# Patient Record
Sex: Female | Born: 1950
Health system: Southern US, Community
[De-identification: ages and names within clinical notes are randomized; demographics above are authoritative.]

## PROBLEM LIST (undated history)

## (undated) DIAGNOSIS — K5730 Diverticulosis of large intestine without perforation or abscess without bleeding: Secondary | ICD-10-CM

## (undated) DIAGNOSIS — E785 Hyperlipidemia, unspecified: Secondary | ICD-10-CM

## (undated) DIAGNOSIS — Z923 Personal history of irradiation: Secondary | ICD-10-CM

## (undated) DIAGNOSIS — K439 Ventral hernia without obstruction or gangrene: Secondary | ICD-10-CM

## (undated) DIAGNOSIS — M1990 Unspecified osteoarthritis, unspecified site: Secondary | ICD-10-CM

## (undated) DIAGNOSIS — L719 Rosacea, unspecified: Secondary | ICD-10-CM

## (undated) DIAGNOSIS — K760 Fatty (change of) liver, not elsewhere classified: Secondary | ICD-10-CM

## (undated) DIAGNOSIS — Z9889 Other specified postprocedural states: Secondary | ICD-10-CM

## (undated) DIAGNOSIS — E039 Hypothyroidism, unspecified: Secondary | ICD-10-CM

## (undated) DIAGNOSIS — R112 Nausea with vomiting, unspecified: Secondary | ICD-10-CM

## (undated) DIAGNOSIS — C50919 Malignant neoplasm of unspecified site of unspecified female breast: Secondary | ICD-10-CM

## (undated) DIAGNOSIS — I10 Essential (primary) hypertension: Secondary | ICD-10-CM

## (undated) DIAGNOSIS — C439 Malignant melanoma of skin, unspecified: Secondary | ICD-10-CM

## (undated) DIAGNOSIS — E1121 Type 2 diabetes mellitus with diabetic nephropathy: Secondary | ICD-10-CM

## (undated) DIAGNOSIS — Z8742 Personal history of other diseases of the female genital tract: Secondary | ICD-10-CM

## (undated) DIAGNOSIS — C801 Malignant (primary) neoplasm, unspecified: Secondary | ICD-10-CM

## (undated) HISTORY — PX: BREAST LUMPECTOMY: SHX2

## (undated) HISTORY — DX: Malignant (primary) neoplasm, unspecified: C80.1

## (undated) HISTORY — DX: Fatty (change of) liver, not elsewhere classified: K76.0

## (undated) HISTORY — PX: HEMANGIOMA EXCISION: SHX1734

## (undated) HISTORY — DX: Essential (primary) hypertension: I10

## (undated) HISTORY — DX: Ventral hernia without obstruction or gangrene: K43.9

## (undated) HISTORY — DX: Rosacea, unspecified: L71.9

## (undated) HISTORY — PX: FOOT SURGERY: SHX648

## (undated) HISTORY — PX: WISDOM TOOTH EXTRACTION: SHX21

## (undated) HISTORY — DX: Hyperlipidemia, unspecified: E78.5

## (undated) HISTORY — PX: HERNIA REPAIR: SHX51

---

## 1986-09-09 HISTORY — PX: DIAGNOSTIC LAPAROSCOPY: SUR761

## 1996-09-09 HISTORY — PX: BREAST SURGERY: SHX581

## 1997-09-09 DIAGNOSIS — C801 Malignant (primary) neoplasm, unspecified: Secondary | ICD-10-CM

## 1997-09-09 HISTORY — DX: Malignant (primary) neoplasm, unspecified: C80.1

## 1998-01-11 ENCOUNTER — Encounter: Admission: RE | Admit: 1998-01-11 | Discharge: 1998-04-11 | Payer: Self-pay | Admitting: *Deleted

## 1998-09-28 ENCOUNTER — Ambulatory Visit (HOSPITAL_COMMUNITY): Admission: RE | Admit: 1998-09-28 | Discharge: 1998-09-28 | Payer: Self-pay | Admitting: Otolaryngology

## 1998-09-28 ENCOUNTER — Encounter: Payer: Self-pay | Admitting: Surgery

## 1999-09-25 ENCOUNTER — Encounter: Admission: RE | Admit: 1999-09-25 | Discharge: 1999-09-25 | Payer: Self-pay | Admitting: Family Medicine

## 1999-09-25 ENCOUNTER — Encounter: Payer: Self-pay | Admitting: Family Medicine

## 2000-03-26 ENCOUNTER — Encounter: Admission: RE | Admit: 2000-03-26 | Discharge: 2000-03-26 | Payer: Self-pay | Admitting: Surgery

## 2000-03-26 ENCOUNTER — Encounter: Payer: Self-pay | Admitting: Surgery

## 2001-04-07 ENCOUNTER — Encounter: Admission: RE | Admit: 2001-04-07 | Discharge: 2001-04-07 | Payer: Self-pay | Admitting: Surgery

## 2001-04-07 ENCOUNTER — Encounter: Payer: Self-pay | Admitting: Surgery

## 2001-05-06 ENCOUNTER — Other Ambulatory Visit: Admission: RE | Admit: 2001-05-06 | Discharge: 2001-05-06 | Payer: Self-pay | Admitting: Family Medicine

## 2002-04-12 ENCOUNTER — Encounter: Payer: Self-pay | Admitting: Surgery

## 2002-04-12 ENCOUNTER — Encounter: Admission: RE | Admit: 2002-04-12 | Discharge: 2002-04-12 | Payer: Self-pay | Admitting: Surgery

## 2003-05-03 ENCOUNTER — Encounter: Admission: RE | Admit: 2003-05-03 | Discharge: 2003-05-03 | Payer: Self-pay | Admitting: Surgery

## 2003-05-03 ENCOUNTER — Encounter: Payer: Self-pay | Admitting: Surgery

## 2003-08-05 ENCOUNTER — Emergency Department (HOSPITAL_COMMUNITY): Admission: EM | Admit: 2003-08-05 | Discharge: 2003-08-05 | Payer: Self-pay | Admitting: Emergency Medicine

## 2003-12-16 ENCOUNTER — Encounter (INDEPENDENT_AMBULATORY_CARE_PROVIDER_SITE_OTHER): Payer: Self-pay | Admitting: *Deleted

## 2003-12-16 ENCOUNTER — Ambulatory Visit (HOSPITAL_COMMUNITY): Admission: RE | Admit: 2003-12-16 | Discharge: 2003-12-16 | Payer: Self-pay

## 2003-12-16 ENCOUNTER — Ambulatory Visit (HOSPITAL_BASED_OUTPATIENT_CLINIC_OR_DEPARTMENT_OTHER): Admission: RE | Admit: 2003-12-16 | Discharge: 2003-12-16 | Payer: Self-pay

## 2004-05-22 ENCOUNTER — Encounter: Admission: RE | Admit: 2004-05-22 | Discharge: 2004-05-22 | Payer: Self-pay | Admitting: Surgery

## 2004-09-11 ENCOUNTER — Encounter: Admission: RE | Admit: 2004-09-11 | Discharge: 2004-09-11 | Payer: Self-pay | Admitting: Family Medicine

## 2004-09-11 ENCOUNTER — Other Ambulatory Visit: Admission: RE | Admit: 2004-09-11 | Discharge: 2004-09-11 | Payer: Self-pay | Admitting: Family Medicine

## 2005-06-13 ENCOUNTER — Encounter: Admission: RE | Admit: 2005-06-13 | Discharge: 2005-06-13 | Payer: Self-pay | Admitting: Obstetrics and Gynecology

## 2005-08-12 ENCOUNTER — Ambulatory Visit (HOSPITAL_COMMUNITY): Admission: RE | Admit: 2005-08-12 | Discharge: 2005-08-12 | Payer: Self-pay | Admitting: Obstetrics and Gynecology

## 2006-05-26 ENCOUNTER — Other Ambulatory Visit: Admission: RE | Admit: 2006-05-26 | Discharge: 2006-05-26 | Payer: Self-pay | Admitting: Family Medicine

## 2006-06-16 ENCOUNTER — Encounter: Admission: RE | Admit: 2006-06-16 | Discharge: 2006-06-16 | Payer: Self-pay | Admitting: Surgery

## 2006-06-18 ENCOUNTER — Encounter: Admission: RE | Admit: 2006-06-18 | Discharge: 2006-06-18 | Payer: Self-pay | Admitting: Surgery

## 2006-09-09 HISTORY — PX: ABDOMINAL HYSTERECTOMY: SHX81

## 2007-04-23 ENCOUNTER — Encounter: Admission: RE | Admit: 2007-04-23 | Discharge: 2007-04-23 | Payer: Self-pay | Admitting: Family Medicine

## 2007-06-01 ENCOUNTER — Ambulatory Visit (HOSPITAL_COMMUNITY): Admission: RE | Admit: 2007-06-01 | Discharge: 2007-06-01 | Payer: Self-pay | Admitting: Gynecologic Oncology

## 2007-06-02 ENCOUNTER — Ambulatory Visit: Admission: RE | Admit: 2007-06-02 | Discharge: 2007-06-02 | Payer: Self-pay | Admitting: Gynecologic Oncology

## 2007-06-02 ENCOUNTER — Encounter: Payer: Self-pay | Admitting: Gynecologic Oncology

## 2007-06-02 ENCOUNTER — Other Ambulatory Visit: Admission: RE | Admit: 2007-06-02 | Discharge: 2007-06-02 | Payer: Self-pay | Admitting: Gynecologic Oncology

## 2007-06-19 ENCOUNTER — Encounter: Admission: RE | Admit: 2007-06-19 | Discharge: 2007-06-19 | Payer: Self-pay | Admitting: Surgery

## 2007-07-14 ENCOUNTER — Inpatient Hospital Stay (HOSPITAL_COMMUNITY): Admission: RE | Admit: 2007-07-14 | Discharge: 2007-07-17 | Payer: Self-pay | Admitting: Obstetrics & Gynecology

## 2007-07-14 ENCOUNTER — Encounter: Payer: Self-pay | Admitting: Gynecologic Oncology

## 2007-09-01 ENCOUNTER — Ambulatory Visit: Admission: RE | Admit: 2007-09-01 | Discharge: 2007-09-01 | Payer: Self-pay | Admitting: Gynecologic Oncology

## 2007-10-06 ENCOUNTER — Inpatient Hospital Stay (HOSPITAL_COMMUNITY): Admission: RE | Admit: 2007-10-06 | Discharge: 2007-10-08 | Payer: Self-pay | Admitting: Surgery

## 2008-06-22 ENCOUNTER — Ambulatory Visit (HOSPITAL_COMMUNITY): Admission: RE | Admit: 2008-06-22 | Discharge: 2008-06-22 | Payer: Self-pay | Admitting: Surgery

## 2008-06-28 ENCOUNTER — Encounter: Admission: RE | Admit: 2008-06-28 | Discharge: 2008-06-28 | Payer: Self-pay | Admitting: Surgery

## 2008-09-06 ENCOUNTER — Emergency Department (HOSPITAL_COMMUNITY): Admission: EM | Admit: 2008-09-06 | Discharge: 2008-09-06 | Payer: Self-pay | Admitting: Family Medicine

## 2008-09-09 HISTORY — PX: COLONOSCOPY: SHX174

## 2008-09-30 ENCOUNTER — Inpatient Hospital Stay (HOSPITAL_COMMUNITY): Admission: RE | Admit: 2008-09-30 | Discharge: 2008-10-01 | Payer: Self-pay | Admitting: Surgery

## 2008-12-05 IMAGING — CR DG CHEST 2V
2 series · 2 of 2 positions shown · non-contrast
Comparison: 09/11/04.

CLINICAL DATA: Pelvic mass.  Preop.  
 CHEST ? 2 VIEW:

[w chest pa]
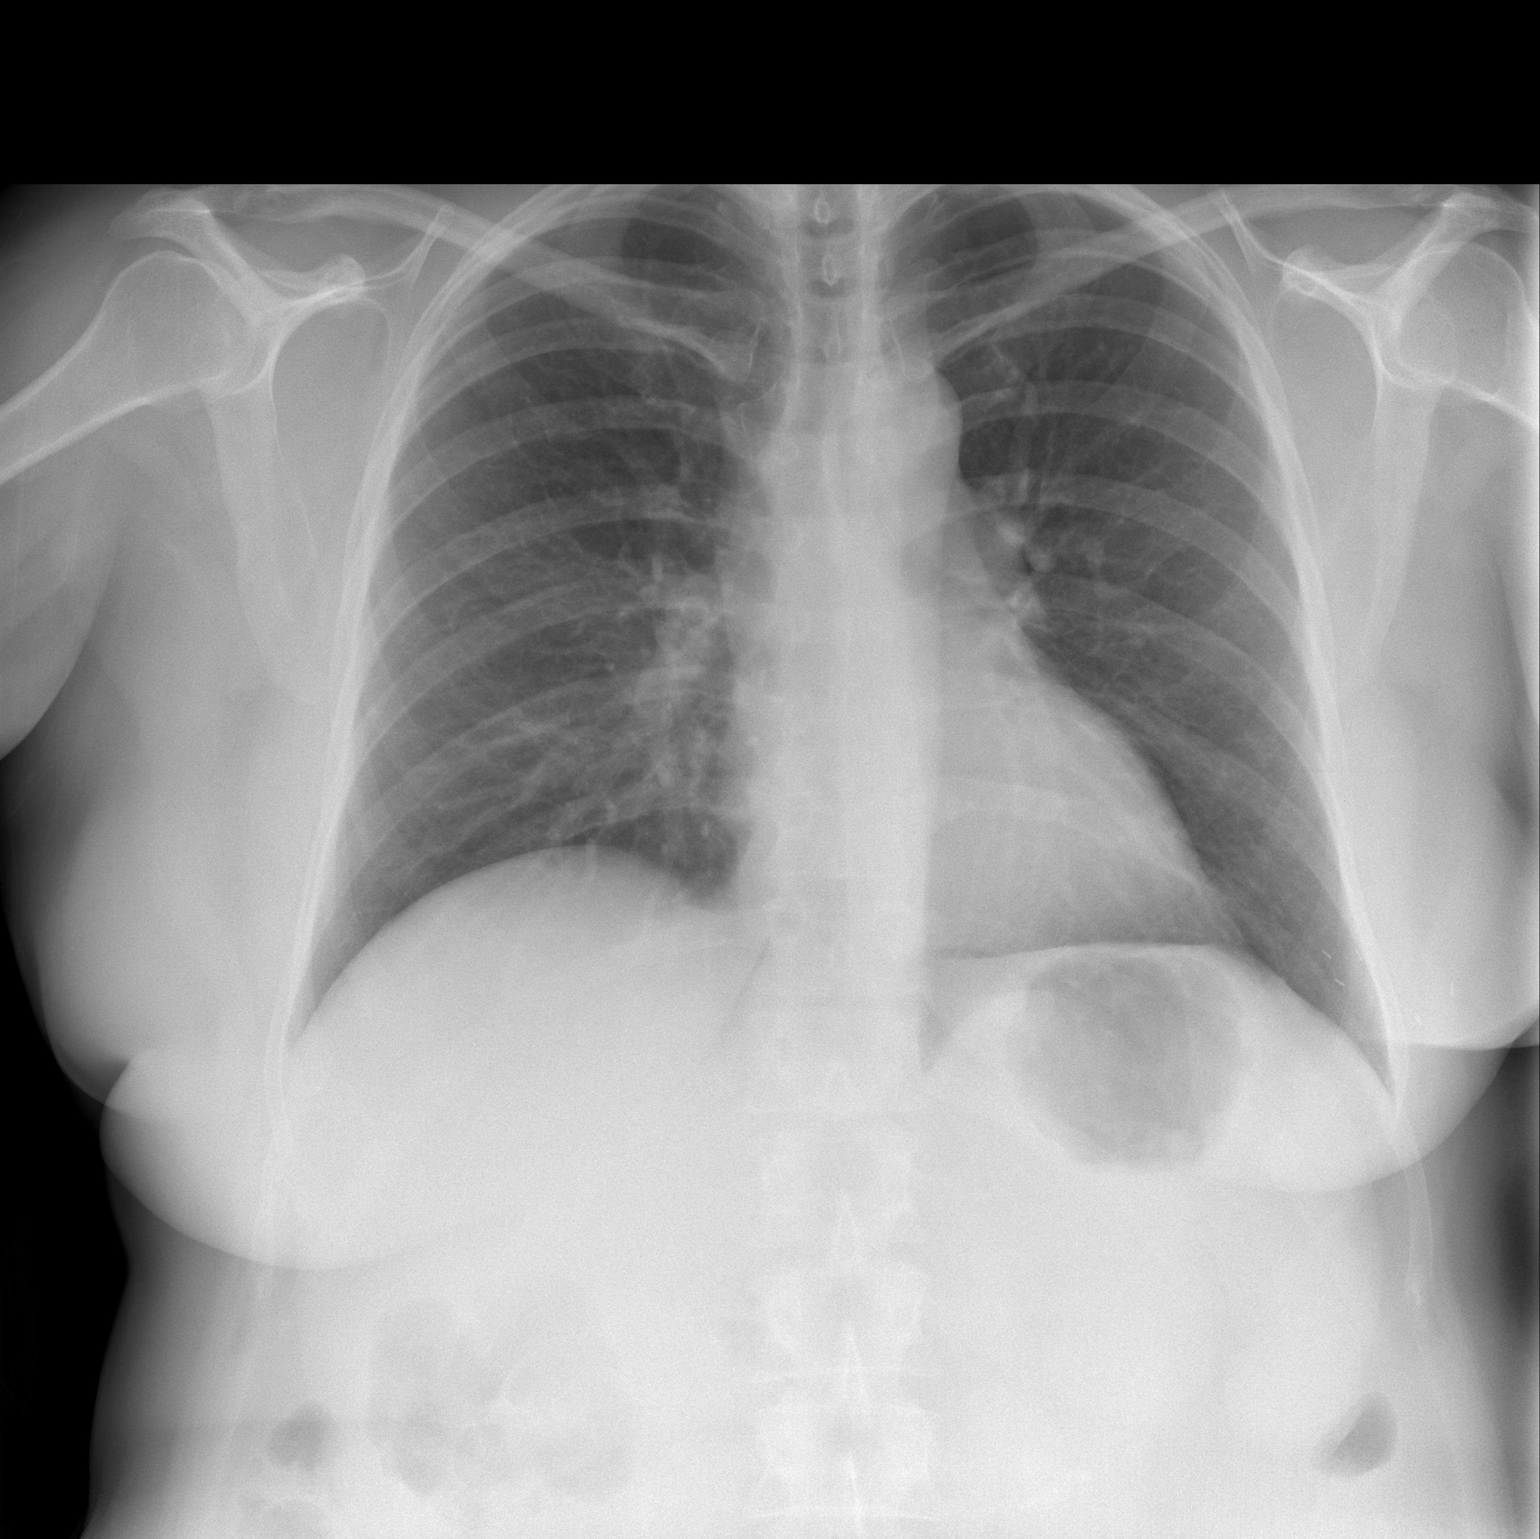

[w chest lat]
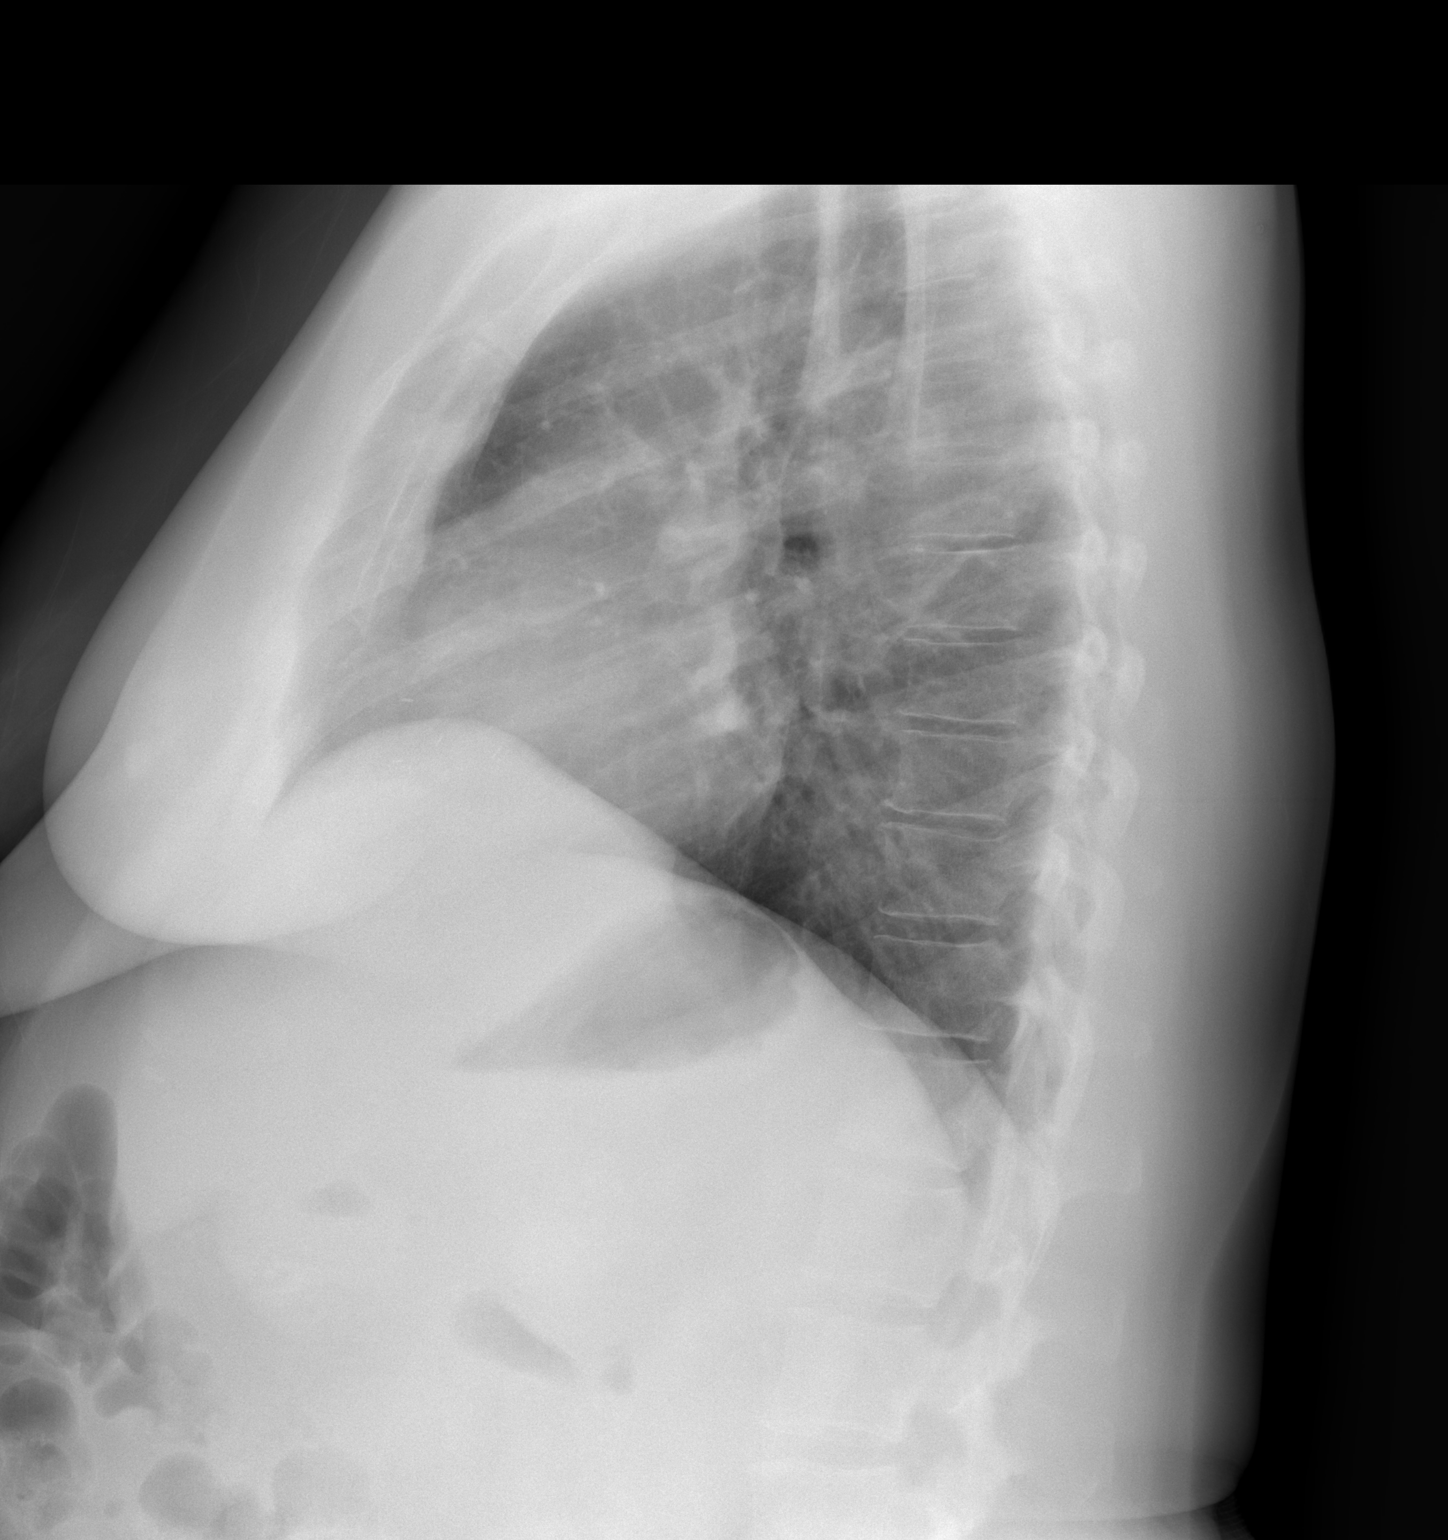

[2 of 2 positions shown; findings below may reference images not displayed]

FINDINGS: The heart size and mediastinal contours are within normal limits.  Both lungs are clear.  The visualized skeletal structures are unremarkable.
IMPRESSION: No active cardiopulmonary disease.  Stable compared to prior exam.

## 2009-03-14 ENCOUNTER — Ambulatory Visit (HOSPITAL_COMMUNITY): Admission: RE | Admit: 2009-03-14 | Discharge: 2009-03-14 | Payer: Self-pay | Admitting: Gastroenterology

## 2009-08-08 ENCOUNTER — Encounter: Admission: RE | Admit: 2009-08-08 | Discharge: 2009-08-08 | Payer: Self-pay | Admitting: Family Medicine

## 2009-08-21 ENCOUNTER — Ambulatory Visit (HOSPITAL_COMMUNITY): Admission: RE | Admit: 2009-08-21 | Discharge: 2009-08-21 | Payer: Self-pay | Admitting: Family Medicine

## 2010-08-09 ENCOUNTER — Encounter: Admission: RE | Admit: 2010-08-09 | Discharge: 2010-08-09 | Payer: Self-pay | Admitting: Surgery

## 2010-09-29 ENCOUNTER — Encounter: Payer: Self-pay | Admitting: Surgery

## 2010-11-12 ENCOUNTER — Other Ambulatory Visit (HOSPITAL_COMMUNITY): Payer: Self-pay | Admitting: Internal Medicine

## 2010-11-12 DIAGNOSIS — R945 Abnormal results of liver function studies: Secondary | ICD-10-CM

## 2010-11-21 ENCOUNTER — Ambulatory Visit (HOSPITAL_COMMUNITY)
Admission: RE | Admit: 2010-11-21 | Discharge: 2010-11-21 | Disposition: A | Discharge status: Home / Self Care | Payer: Commercial Managed Care - PPO | Source: Ambulatory Visit | Attending: Internal Medicine | Admitting: Internal Medicine

## 2010-11-21 DIAGNOSIS — R945 Abnormal results of liver function studies: Secondary | ICD-10-CM

## 2010-11-21 DIAGNOSIS — K7689 Other specified diseases of liver: Secondary | ICD-10-CM | POA: Insufficient documentation

## 2010-11-21 DIAGNOSIS — R7989 Other specified abnormal findings of blood chemistry: Secondary | ICD-10-CM | POA: Insufficient documentation

## 2010-12-24 LAB — CBC
HCT: 28.7 % — ABNORMAL LOW (ref 36.0–46.0)
Platelets: 184 10*3/uL (ref 150–400)
RDW: 12.6 % (ref 11.5–15.5)

## 2010-12-24 LAB — BASIC METABOLIC PANEL
BUN: 11 mg/dL (ref 6–23)
BUN: 17 mg/dL (ref 6–23)
Calcium: 10.3 mg/dL (ref 8.4–10.5)
Calcium: 8.4 mg/dL (ref 8.4–10.5)
Creatinine, Ser: 0.76 mg/dL (ref 0.4–1.2)
GFR calc non Af Amer: >60 mL/min (ref >60)
GFR calc non Af Amer: >60 mL/min (ref >60)
Glucose, Bld: 241 mg/dL — ABNORMAL HIGH (ref 70–99)

## 2010-12-24 LAB — HEMOGLOBIN AND HEMATOCRIT, BLOOD: Hemoglobin: 14.8 g/dL (ref 12.0–15.0)

## 2011-01-22 NOTE — H&P (Signed)
Susan Montes, Susan Montes              ACCOUNT NO.:  [id number]   MEDICAL RECORD NO.:  [id number]         PATIENT TYPE:  LINP   LOCATION:                               FACILITY:  [facility]   PHYSICIAN:  Paola A. Duard Brady, MD    DATE OF BIRTH:  1951/06/20   DATE OF ADMISSION:  07/14/2007  DATE OF DISCHARGE:                              HISTORY & PHYSICAL   PRIMARY CARE PHYSICIAN:  Donia Guiles, M.D. and Gerald Leitz, M.D.   Susan Montes is a 60-year gravida 2, para 2 who has been menopausal for 2  years.  She began having some postmenopausal bleeding.  Ultrasound  revealed the left ovary to have a cystic mass measuring 9.2 x 6.9 x 6.7  cm which was felt to be avascular.  She had a thin endometrial stripe.  The right ovary was normal in appearance.  CA-125 was normal at 7.3.  We  initially saw the patient on June 02, 2007.  Endometrial biopsy was  performed at that time which revealed superficial fragments of benign  proliferative endometrium with tubal metaplasia.  The patient wishes to  proceed with laparoscopic BSO.   PAST MEDICAL HISTORY:  1. Her only past medical history which is significant is a history of      breast cancer diagnosed in 1999.  2. Hypertension.  3. Rosacea.   MEDICATIONS:  1. Lisinopril.  2. __________  Cream.   PAST SURGICAL HISTORY:  1. Lumpectomy and radiation on the left side.  2. Laparoscopy in 1988 for endometriosis.   ALLERGIES:  1. NARCOTICS which cause significant nausea.  2. ANESTHESIA causes nausea.   SOCIAL HISTORY:  She is married.  She works as a Holiday representative at [employer].   PHYSICAL EXAMINATION:  Physical examination performed on June 02, 2007 which will be updated on July 14, 2007 revealed:  GENERAL:  A well-nourished, well-developed female in no acute distress.  LUNGS:  Clear to auscultation bilaterally.  CARDIOVASCULAR:  Regular rate and rhythm.  ABDOMEN:  Morbidly obese, soft and nontender.  There were no palpable  masses  or hepatosplenomegaly.  PELVIC:  Normal external genitalia.  The cervix and the vagina were  atrophic.  Bimanual examination revealed no masses or nodularity, but  exam was limited by habitus.   ASSESSMENT:  A 60 year old complex adnexal mass.  Normal CA-125 and  thickened endometrial stripe with postmenopausal bleeding.  Her biopsy  which we performed in the office on June 02, 2007 was negative.  Will  therefore consent her and proceed with laparoscopic bilateral salpingo-  oophorectomy, possible hysterectomy and staging.  Risks and benefits of  the surgery have been discussed with the patient previously and she  wishes to proceed.      Paola A. Duard Brady, MD  Electronically Signed     PAG/MEDQ  D:  07/13/2007  T:  07/13/2007  Job:  [id number]

## 2011-01-22 NOTE — Op Note (Signed)
NAMEINDEA, DEARMAN              ACCOUNT NO.:  [id number]   MEDICAL RECORD NO.:  [id number]          PATIENT TYPE:  AMB   LOCATION:  ENDO                         FACILITY:  [facility]   PHYSICIAN:  John C. Madilyn Fireman, M.D.    DATE OF BIRTH:  Oct 31, 1950   DATE OF PROCEDURE:  03/14/2009  DATE OF DISCHARGE:                               OPERATIVE REPORT   PROCEDURE PERFORMED:  Colonoscopy.   INDICATIONS FOR PROCEDURE:  Rectal bleeding.   DESCRIPTION OF PROCEDURE:  The patient was placed in the left lateral  decubitus position and placed on the pulse monitor with continuous low-  flow oxygen delivered by nasal cannula.  She was sedated with 100 mcg IV  fentanyl and 10 mg IV Versed.  Olympus video colonoscope was inserted  into the rectum and advanced to the cecum, confirmed by  transillumination of McBurney's point and visualization of the ileocecal  valve and appendiceal orifice.  The prep was good.  The cecum,  ascending, transverse, descending and sigmoid colon all appeared normal  with no masses, polyps, diverticula or other mucosal abnormalities.  The  rectum likewise appeared normal.  On retroflex view of the anus revealed  only small internal hemorrhoids.  No stigma of hemorrhage.  The scope  was then withdrawn and the patient returned to the recovery room in  stable condition.  She tolerated the procedure well.  There were no  immediate complications.   IMPRESSION:  Small internal hemorrhoids, otherwise normal study.   PLAN:  Check Hemoccults in 5 years and repeat screening colonoscopy in  10 years unless Hemoccult is positive earlier.           ______________________________  Everardo All Madilyn Fireman, M.D.     JCH/MEDQ  D:  03/14/2009  T:  03/14/2009  Job:  [id number]   cc:   Donia Guiles, M.D.  Fax: [phone]

## 2011-01-22 NOTE — Op Note (Signed)
Susan Montes, Susan Montes              ACCOUNT NO.:  [id number]   MEDICAL RECORD NO.:  [id number]          PATIENT TYPE:  INP   LOCATION:  0004                         FACILITY:  [facility]   PHYSICIAN:  Sandria Bales. Ezzard Standing, M.D.  DATE OF BIRTH:  19-Feb-1951   DATE OF PROCEDURE:  09/30/2008  DATE OF DISCHARGE:                               OPERATIVE REPORT   Date of surgery ?   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   POSTOPERATIVE DIAGNOSIS:  Recurrent incisional ventral hernia lower  portion of prior repair.   PROCEDURES:  Laparoscopic repair of recurrent incisional ventral hernia  using the previously placed Parietex mesh.   SURGEON:  Sandria Bales. Ezzard Standing, M.D.   FIRST ASSISTANT:  Ollen Gross. Vernell Morgans, M.D.   ANESTHESIA:  General endotracheal.   ESTIMATED BLOOD LOSS:  100 mL.   DRAINS:  None.   INDICATIONS FOR PROCEDURE:  Ms. Wingard is a 60 year old white female  who had a lower abdominal incision in November 2008 for large left  ovarian mass.  Unfortunately she had a very rapid development of a lower  abdominal hernia.  I repaired this hernia on October 01, 2007, with a 20  x 25 cm Parietex mesh.  She did well for about 6 months and then noticed  an increasing bulge in her right lower abdomen.  I did CT scans and  physical examination and she has recurrence of her hernia in the lower  abdomen.   Unfortunately for Marsella she continued to be morbidly obese with a  weight of 221 pounds and a calculated BMI of 37.9.  I have spoken to her  at length about the importance of getting her weight down if these  hernias are not going to come back.   The indications, potential complications of surgery were explained to  the patient.  The potential complications include bleeding, infection,  bowel injury, failure of the hernia again.  We will try to do the  surgery laparoscopically.   OPERATIVE NOTE:  The patient was placed in a supine position.  She  was  given a general anesthetic.  Her abdomen was prepped with Betadine  solution, sterilely draped with Ioban drapes.  She had a Foley catheter  in place.  She was given 1 g of Ancef at initiation of the procedure.  A  time-out was held for identifying the patient and the procedure.   The abdomen was accessed through the left upper quadrant using a 10 mm  Optiview trocar.  I ended up placing a 10 mm trocar in the left mid  abdomen and a 5 mm in the left lower abdomen and a 5 mm in the right  upper abdomen.  I spent the first 15 to 30 minutes taking adhesions down  from her prior mesh repair but these are kind of thin adhesions of  omentum.  There was no bowel stuck to the mesh and actually it came down  fairly easily.  I then spent the next hour to an hour and a half  dissecting out the lower part of  the wound.  It looked like about 80 %  of her mesh had held up well and it was the lower part right in the  suprapubic area where this mesh had failed.  However, I was able to find  a rim of fascia which was anterior to the pubic bone and it looked like  she had about maybe a 2 cm rim that I could possibly sew to.   I then spent better than an hour placing the Ethibond sutures.  I placed  one #1 Ethibond and then I placed eight 0 Ethibond sutures using the tie  knots to cinch these down in with this mesh brought back to the  suprapubic fascia.  When I was finished I thought I had a good closure  and returned the mesh to its original location.  I then took the  peritoneum from the hernia site that I dissected out and flapped it back  on the undersurface of the Parietex mesh and sewed this in placed with  interrupted running 3-0 Vicryl sutures again trying to protect this new  repair.   At this point I reinspected the abdomen.  She did have some bleeding  from her mesentery.  I found a couple little vessels that I either used  cautery on or used the Harmonic scalpel on and irrigated the  abdomen  with saline so I probably lost 100 plus mL from this omental drainage.  I will check a CBC on her tomorrow.   Otherwise her sponge and needle count were correct at the end of the  case.  She tolerated the procedure well.  She was transferred to  recovery room in good condition.      Sandria Bales. Ezzard Standing, M.D.  Electronically Signed     DHN/MEDQ  D:  09/30/2008  T:  09/30/2008  Job:  [id number]   cc:   Donia Guiles, M.D.  Fax: [phone]

## 2011-01-22 NOTE — Consult Note (Signed)
NAMECHELLY, DOMBECK              ACCOUNT NO.:  [id number]   MEDICAL RECORD NO.:  [id number]          PATIENT TYPE:  OUT   LOCATION:  GYN                          FACILITY:  [facility]   PHYSICIAN:  Paola A. Duard Brady, MD    DATE OF BIRTH:  1951/04/02   DATE OF CONSULTATION:  06/02/2007  DATE OF DISCHARGE:                                 CONSULTATION   PRIMARY CARE PHYSICIAN:  Donia Guiles, MD.   REASON FOR CONSULTATION:  The patient is seen today in consultation at  the request of Dr. Richardson Dopp.   HISTORY OF PRESENT ILLNESS:  Ms. Elden is a 60 year old gravida 2 para  2 who has been menopausal for about 2 years.  She was complaining of  some sore breasts, was seen by her primary physician and had a mammogram  done.  She then began having some postmenopausal bleeding.  She was seen  by Dr. Richardson Dopp.  At Dr. Dawayne Patricia office, an ultrasound was performed.  The  ultrasound revealed the left ovary to have a cystic mass with septations  measuring 9.2 x 6.9 x 6.7 cm.  They were felt to be avascular.  Left  ovarian tissue was not seen.  The endometrial lining was thin at 0.39  cm.  The right ovary was normal in appearance.  Pelvic examination was  deferred.  An endometrial biopsy or Pap smear were not performed at that  time.  A CA125 was performed that was normal at 7.6.  We were then  consulted to see the patient.  The patient had a transvaginal ultrasound  performed yesterday which revealed the uterus to be 6.2 x 2.9 x 3.7 cm.  The endometrial stripe thickness was enlarged at 6.5 mm.  There is a  complex cystic lesion involving the adnexa measuring 8.5 x 6 x 8 cm;  surrounding thin rim of ovarian tissue was noted.  There were 2 thick  partial septations noted which demonstrated some flow as well as a mural  nodule measuring 1.6 x 1.3 cm.  There was no pelvic fluid seen.  The  right ovary could not be seen with confidence.  I was called with the  results yesterday.  The bleeding that she had lasted for  about 7 days; 2-  3 days were fairly heavy when she was changing a pad about 2-to-3 times  per day, then lighter and crampy.  She has had no bleeding since that  time.  She states now that she has this known mass, she is experiencing  some cramping but retrospect was not feeling any cramping, any pelvic  pressure or change in bowel or bladder habits.   PAST MEDICAL HISTORY:  Significant for breast cancer diagnosed in 1999.  She has hypertension and rosacea.   MEDICATIONS:  Lisinopril, Mycillin, MetroGel cream.   PAST SURGICAL HISTORY:  Lumpectomy and radiation on the left side.  She  was not on any tamoxifen.  She did not receive any chemotherapy.  She  had laparoscopy in 1988 for endometriosis.  She had that surgery 3 years  ago due to vascular issues.   ALLERGIES:  NARCOTICS which cause significant nausea.  She has  significant nausea with ANESTHESIA and very fearful of anesthetics with  their subsequent nausea.   SOCIAL HISTORY:  She is married.  She denies use of tobacco.  She drinks  alcohol occasionally.  She works as a Holiday representative at [employer].  Her  husband is in Air[employer].   FAMILY HISTORY:  Her maternal grandmother had breast cancer at the age  of 68.  Her father had hypertension.  She has 2 children.  She has had 2  vaginal deliveries.   HEALTH MAINTENANCE:  She had a Pap smear that was normal in September,  2007.  She never had a colonoscopy but does have occult blood sampling  that was negative.   PHYSICAL EXAMINATION:  VITAL SIGNS:  Weight 222 pounds, height 5 feet 4  inches, blood pressure 134/84, pulse 96.  GENERAL:  Well-nourished, well-developed female in no acute distress.  NECK:  Supple.  There is no lymphadenopathy, no thyromegaly.  LUNGS:  Clear to auscultation bilaterally.  CARDIOVASCULAR:  Regular rate and rhythm.  ABDOMEN:  Morbidly obese, soft, nontender, nondistended.  There are no  palpable masses or hepatosplenomegaly.  Exam is limited by  habitus.  Groins are negative for adenopathy.  EXTREMITIES:  There is no edema.  PELVIC:  External genitalia is within normal limits.  Speculum was  inserted.  The vagina is without lesions.  The cervix is visualized; it  is multiparous.  There is a physiologic discharge.  There are no visible  lesions.  ThinPrep Pap was submitted without difficulty.  After  obtaining the patient's consent, the cervix was grasped with a single  tooth tenaculum.  The endometrial biopsy Pipelle was passed without  difficulty.  The uterus sounded to 9 cm.  Endometrial biopsy was  performed without difficulty.  The patient tolerated it well.  Tenaculum  was removed.  There is no bleeding on the cervix.  Bimanual examination  is limited by habitus.  I cannot appreciate any masses.   ASSESSMENT:  The patient is a 60 year old with a complex left adnexal  mass, normal CA125, thickened endometrial stripe and postmenopausal  bleeding.   PLAN:  We will follow up the results for biopsy from today.  She very  much would like to have her surgery done at [facility].  She does  understand the issue we have of operative shortage here.  If the biopsy  is positive, I would recommend that she go to [facility] for definitive  surgery with lymphadenectomy.  If the biopsy is negative, while it would  be reasonable to have the surgery as soon as possible, she may be  interested in waiting until November, which is our next available OR  time here, in order to have the surgery at [facility].  We will  determine this issue with the patient once we have the results of the  biopsy.  I discussed with the patient that I would be willing to try to  perform her surgery laparoscopically.  The limitation is her body  habitus.  She does understand the need for possible laparotomy and  accepts that.  We will determine her final plan based on the results  from her endometrial biopsy.      Paola A. Duard Brady, MD  Electronically  Signed     PAG/MEDQ  D:  06/02/2007  T:  06/03/2007  Job:  [id number]   cc:   Gerald Leitz, MD   Izora Ribas  Arvilla Market, M.D.  Fax: [phone]   Telford Nab, R.N.  [address]  [place], [place] [address]

## 2011-01-22 NOTE — Op Note (Signed)
Susan Montes, Susan Montes              ACCOUNT NO.:  [id number]   MEDICAL RECORD NO.:  [id number]          PATIENT TYPE:  INP   LOCATION:  5153                         FACILITY:  [facility]   PHYSICIAN:  Sandria Bales. Ezzard Standing, M.D.  DATE OF BIRTH:  Sep 28, 1950   DATE OF PROCEDURE:  10/06/2007  DATE OF DISCHARGE:                               OPERATIVE REPORT   Operative Note   PREOPERATIVE DIAGNOSIS:  Lower abdominal incisional hernia.   POSTOPERATIVE DIAGNOSIS:  Lower abdominal incisional hernia  approximately 15 x 16 cm in diameter.   PROCEDURE:  Laparoscopic ventral hernia repair with Parietex mesh 20 x  25 cm.   SURGEON:  Ovidio Kin, Sabino Snipes, [facility] is first assistant.   ANESTHESIA:  General endotracheal.   ESTIMATED BLOOD LOSS:  Minimal.   INDICATIONS FOR PROCEDURE:  Ms. Messman is a 60 year old white female  who sees Dr. Donia Guiles from a primary care standpoint.  She  underwent a total abdominal hysterectomy in November 2008 for a large  cystic left ovarian mass which proved to be benign.  Her surgery was  carried out by Dr. Cleda Mccreedy.  However, fairly early after surgery,  Ms. Candella noticed a bulge in her lower abdomen and on physical exam  she has a lower abdominal incisional hernia.   I discussed with the patient repairing this hernia both laparoscopic and  open repairs, the risk of surgery includes bleeding, infection,  inability to repair the hernia laparoscopically and the significant risk  of recurrence, particularly, with her location being her lower abdomen.   OPERATIVE NOTE:  The patient comes to the operating room under general anesthesia.  She  had a Foley catheter placed, PAS stockings in place, given a gram of  Ancef at initiation of the procedure.  Her abdomen was prepped with  Betadine solution and an Ioban drape placed over abdomen.  I accessed  her abdomen through left upper quadrant 11 mm Ethicon trocar.  Once in  the abdomen, I insufflated  the abdomen. Both right and left lobes of the  liver have evidence of fatty infiltration.  Anterior wall of the stomach  was unremarkable.  Her bowel that I could see was unremarkable.   She had a lot of adhesions to her lower abdominal wall to the hernia but  these were primarily omental adhesions with no bowel stuck in it.  I  spent about 15-30 minutes taking down these adhesions out of the lower  abdominal wall incisional hernia sac and then identified the defect  which was fairly large about 15 cm in width, 16 cm in length and went  down to just above the pubic bone.   I then carried out a repair of her hernia.  I got a piece of 20 x 30  Parietex mesh.  However, I cut the top 4 cm off for about a 26 x 20 cm  piece of mesh.  I placed 12 sutures in a circumferential pattern and  actually across the bottom of the mesh I placed five sutures with a more  sutures in the lower part  as I figured this was where the potential for  failure would come.   I also tried to take down some of the preperitoneal fat down to the  pubic bone, took photos of the hernia.  Unfortunately the printer  machine was not working very well so the pictures were a very poor  quality.   I placed the prosthesis mesh in the abdominal cavity.  I then used a  suture grasper to grab the mesh and pull this up to the anterior  abdominal wall.  I sewed 0-0 Novofil sutures into the Parietex mesh,  that is where I pulled through the abdominal wall.  I tied these knots  down.  I then used a stapler.  I used a total of 54 staples trying to go  1 cm around the edge of the mesh and then did some internal staples. I  stapled inside to kind of tack the mesh up to the anterior abdominal  wall.   At this point the patient tolerated procedure well.  Again the bowel  really never was involved in this dissection.  I desufflated the abdomen  twice making sure the mesh lay flat.  There was no bowel being trapped,  no hernia defect  identified.   The patient is transferred to recovery room, will spend the night in the  hospital and her length of hospitalization will depend on how well she  does with her physical activity and her diet.      Sandria Bales. Ezzard Standing, M.D.  Electronically Signed     DHN/MEDQ  D:  10/06/2007  T:  10/07/2007  Job:  [id number]   cc:   Donia Guiles, M.D.  Paola A. Duard Brady, MD

## 2011-01-22 NOTE — Op Note (Signed)
Susan Montes, Susan Montes              ACCOUNT NO.:  [id number]   MEDICAL RECORD NO.:  [id number]          PATIENT TYPE:  INP   LOCATION:  1529                         FACILITY:  [facility]   PHYSICIAN:  Paola A. Duard Brady, MD    DATE OF BIRTH:  Jan 01, 1951   DATE OF PROCEDURE:  07/14/2007  DATE OF DISCHARGE:                               OPERATIVE REPORT   PREOPERATIVE DIAGNOSIS:  Pelvic mass.   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   PROCEDURE:  1. Total abdominal hysterectomy.  2. Bilateral salpingo-oophorectomy.  3. Umbilical hernia repair.   SURGEONS:  Paola A. Duard Brady, MD and Roseanna Rainbow, M.D.   ASSISTANT:  Telford Nab, R.N.   ANESTHESIA:  General.   ANESTHESIOLOGIST:  Jenelle Mages. Fortune, M.D.   ESTIMATED BLOOD LOSS:  100 mL.   URINE OUTPUT:  250 mL.   IV FLUIDS:  2000 mL.   SPECIMENS:  Uterus, cervix, bilateral tubes and ovaries to pathology.   OPERATIVE FINDINGS INCLUDED:  An 8 cm simple appearing left ovarian mass  on frozen section consistent with a benign mucinous cyst adenofibroma.  Normal atrophic appearing right ovary, normal size uterus.  There is a 1-  cm umbilical hernia with adherent omentum within it.   DESCRIPTION OF PROCEDURE:  The patient was taken to the operating room,  and placed in the supine position where general anesthesia was induced.  This was done after confirming the patient's operative side, reviewed,  consent on chart.  General anesthesia was then induced.  She was placed  in the dorsal lithotomy position with all appropriate precautions.  The  abdomen was then prepped in the usual sterile fashion.  The perineum and  vagina were prepped in the usual sterile fashion, and Foley catheter was  inserted into the bladder under sterile conditions.  The patient was  then draped.  Time-out was then preformed.   A vertical infraumbilical incision was made with a knife and carried  down to the underlying fascia.   The fascia was scored.  The fascial  incision was extended superiorly and inferiorly.  The rectus bellies  were dissected off the overlying fascia.  The peritoneum was tented and  elevated sharply.  The peritoneal incision was extended superiorly and  inferiorly with direct visualization of the underlying peritoneal  cavity.  The patient was then placed in Trendelenburg.  The Bookwalter  self-retaining retractor was then placed on the bed and the medium  blades were placed with all appropriate precautions being taken.  At  this point, and at several points during the case, the position of the  Bookwalter was checked to ensure no significant pressure on that the  psoas muscles.  The large-and-small bowel were then packed using moist  laparotomy sponges.   Our attention was first drawn to the left side.  The utero cornu were  grasped with curved Kelly's.  The round ligament on the patient's left  side was transected with Bovie cautery.  The anterior leaf and posterior  leaves of the broad ligament was then opened with monopolar cautery.  The ureter was identified.  A window was made between the IP and the  ureter on the left side.  The IP was clamped, transected, and suture  ligated with #0 Vicryl.  We then came across the utero-ovarian pedicle,  and the specimen was sent for frozen section.  The bladder flap was then  created in the left side.  The uterine arteries were skeletonized.  They  were clamped x2, transected, suture ligated with #0 Vicryl.   A similar procedure was performed on the patient's right side.  The  round ligament was transected using cautery.  The anterior posterior  leaves of broad ligament were opened.  The bladder flap was then  completed to a point below the level of the cervix.  The posterior leaf  of broad ligament was then opened, the ureter was identified on the  right side.  A window was made between the IP and the ureter.  The IP  was clamped x2,  transected, and suture ligated with #0 Vicryl.  We then  skeletonized the uterine arteries on the patient's right side.  They  were clamped with curved Masterson transected and suture ligated.  We  then continued bilaterally along the cardinal ligament with successive  clamps of Masterson, transection, and suture ligature.  We came across  the cervicovaginal angle with curved Masterson and specimen was handed  off for permanent section.  The vaginal angles were then secured with #0  Vicryl.  The vaginal cuff was closed using interrupted figure-of-eight  sutures of #0 Vicryl.  The abdomen and pelvis were copiously irrigated,  and all pedicles were evaluated and noted to be hemostatic.   Our attention was then drawn to the umbilical hernia.  The bowel  retractors were then removed.  The omentum that was adherent within the  umbilical hernia was then freed using monopolar cautery.  The hernia was  identified.  It was repaired using figure-of-eight suture of #0 Vicryl.  The remainder of the fascial incision was closed using a running mass  closure of #1 PDS.  The subcu tissues were copiously irrigated.  The  skin was closed using skin clips.   The patient tolerated the procedure well and was taken to the recovery  room in stable condition.  All instrument, needle, laparotomy, and Ray-  Tec counts were correct x2.      Paola A. Duard Brady, MD  Electronically Signed     PAG/MEDQ  D:  07/14/2007  T:  07/14/2007  Job:  [id number]   cc:   Maxie Better, M.D.  Fax: [phone]   Roseanna Rainbow, M.D.  Fax: [phone]   Trudee Kuster, M.D.  [address]  [place]  [place] [address]

## 2011-01-22 NOTE — H&P (Signed)
NAMECATTLEYA, Susan Montes              ACCOUNT NO.:  [id number]   MEDICAL RECORD NO.:  [id number]          PATIENT TYPE:  OUT   LOCATION:  GYN                          FACILITY:  [facility]   PHYSICIAN:  Paola A. Duard Brady, MD    DATE OF BIRTH:  1950/11/11   DATE OF ADMISSION:  09/01/2007  DATE OF DISCHARGE:  09/01/2007                              HISTORY & PHYSICAL   Susan Montes is a very pleasant 60 year old who was referred to me by Dr.  Richardson Montes for postmenopausal bleeding as well as a large cystic mass on the  left ovary measuring 9.2 x 6.9 x 6.7 cm.  The patient underwent an  endometrial biopsy which was unremarkable and subsequent underwent  surgery on July 14, 2007.  At that time she underwent exploratory  laparotomy, TAH-BSO and umbilical hernia repair.  Frozen section was  consistent with a benign mucinous cystadenoma.  Final pathology was also  consistent with a benign mucinous cystadenoma of the left ovary.  The  uterus and right tube and ovary were completely unremarkable with no  evidence of malignancy or endometriosis.  The patient did well  postoperatively.  She comes in today for a postoperative visit.  She  states that she has noticed a bulge on her right side and it has been  fairly immediate since the time of her surgery and it does cause her  some pain.  She notices it more when she is standing up, not when she is  lying down.  There is no associated nausea or vomiting with this bulge.  She has no fevers or chills.   PAST MEDICAL HISTORY:  1. Breast cancer diagnosed in 1999.  2. Hypertension.  3. Rosacea.   MEDICATIONS:  Lisinopril, Mysoline and MetroGel cream.   ALLERGIES:  NARCOTICS cause nausea.   PHYSICAL EXAMINATION:  Weight 220 pounds.  Well-nourished, well-developed female in no acute distress.  ABDOMEN:  Upon standing the patient does have an approximately 10-cm  bulge in the right lower abdomen, which is somewhat tender.  It is tense  but not hard.  The  patient was then placed in the supine position.  She  has a well-healed vertical skin incision.  It does appear that she has a  right-sided incisional hernia measuring approximately 4 cm on the  inferior aspect.  The bulge is no longer present and is most likely  consistent with small bowel contents.  At the lower aspect of the margin  of the incision on the right side, a defect is palpable within the  incision.  There remainder of the abdomen is soft and nontender.  PELVIC:  External genitalia is within normal limits.  Speculum  examination revealed the vaginal cuff to be well-healed.  Bimanual  examination:  The vaginal cuff is well healed.  There is no nodularity.   ASSESSMENT:  A 60 year old with a history of breast cancer who had a  large adnexal mass, who on final pathology, fortunately, has benign  disease.  However, unfortunately she has an incisional hernia.  The  patient did have an umbilical hernia repaired at the time  of her surgery  and this appears to be in a separate location.  The patient states that  she had a lot of sneezing immediately postoperatively, at one point did  feel a sharp pain and feels that that might be when a stitch may have  popped.  She has previously had surgery with Dr. Ovidio Montes for her  breast cancer and would like to be evaluated by him for consideration of  a laparoscopic hernia repair.  We have contacted his office and his  office will contact the patient with a date and time for an appointment.  She is very pleased did not have an oncologic though, of course, is  disappointed with the hernia.  I did discuss this with her and she  understands  that this was a risk of surgery and considering her prior hernia, she  was not entirely surprised that this was the case.  She knows that we  will be happy to see her in the future should the need arise.  Because  she has benign disease, she can be released to her primary physician.  We will be happy to  see her in the future should the need arise.      Paola A. Duard Brady, MD  Electronically Signed     PAG/MEDQ  D:  09/01/2007  T:  09/02/2007  Job:  [id number]   cc:   Gerald Leitz, MD   Donia Guiles, M.D.  Fax: [phone]   Telford Nab, R.N.  [address]  [place], [place] [address]   Sandria Bales. Ezzard Standing, M.D.  [address], [address]  [place]  [place] [address]

## 2011-01-25 NOTE — Op Note (Signed)
NAME:  Susan Montes, Susan Montes                        ACCOUNT NO.:  [id number]   MEDICAL RECORD NO.:  [id number]                   PATIENT TYPE:  AMB   LOCATION:  DSC                                  FACILITY:  [facility]   PHYSICIAN:  Alvan Dame, D.P.M.              DATE OF BIRTH:  [birth year]   DATE OF PROCEDURE:  12/16/2003  DATE OF DISCHARGE:                                 OPERATIVE REPORT   PREOPERATIVE DIAGNOSIS:  Suspect soft tissue mass, cannot rule out  hemangioma or dermal skin lesion, plantar right forefoot.   POSTOPERATIVE DIAGNOSIS:  Suspect soft tissue mass, cannot rule out  hemangioma or dermal skin lesion, plantar right forefoot.   PROCEDURE:  Excision of lesion with surrounding border and closure with a  bipedicle flap.   SURGEON:  Alvan Dame, D.P.M.   INDICATIONS:  The patient has had a several year history of a lesion on the  plantar aspect of the foot which has been painful and symptomatic,  fluctuating in size somewhat although for the most part it has been  relatively stable.  The patient also has a history of carcinoma in the past  and at this time there is concern about the possibility that this could also  be some sort of a metastatic lesion which cannot be ruled out.   ANESTHESIA:  IV sedation augmented with local anesthetic, a total of 10 mL,  50:50 mixture of 2% Xylocaine plain and 0.5% Marcaine plain with a PT block  and regional block furnished at this time.  Anesthesia was MAC.   HEMOSTASIS:  Right ankle tourniquet to 250 mmHg.   DESCRIPTION OF PROCEDURE:  The patient was brought to the operating room and  placed on the table in the supine position.  IV sedation was established.  The foot was prepped and draped in the usual aseptic manner.  Local  anesthetic was administered.  The foot was exsanguinated with the Esmarch  wrap and ankle tourniquet inflated to 2590 mmHg.  The following procedure  was then carried out.   Attention was directed to the  plantar aspect of the right foot underlying  the third and fourth metatarsal area in particular.  Approximately a 1.5 cm  lesion was identified.  At least a nearly 1 cm border was made around this  for a total excision of the lesion extending just under 2.5 cm, perhaps 2.25  cm.  At this time, the lesion was noted to be undermined and did not extend  down past subcu tissue.  It seemed to be isolated strictly to the dermal and  subdermal tissue.  It was easily excised to the underlying fat and submitted  in formalin for pathology.  I did not find the need for a _____ section as  the entire lesion was excised in toto at this time and I believe a more  accurate diagnosis with a total section being submitted.  At this  time, the  site was lavaged with copious amounts of sterile antibiotic solutions,  cleared of all soft tissue debris and flap closure was at this time planned.  A bipedicle flap was made from a proximal direction and the flaps oriented  and sutured in place with minimal tension utilizing a combination of 4-0  Prolene and 3-0 nylon just proximal to the site.  The foot was infiltrated  with approximately 10 mg of Decadron or dexamethasone phosphate.  The site  was also dressed with 1/4 inch Steri-Strips to help reduce tension or pull  on the incisions.  For the skin flap, Betadine, saline-soaked sponge and a  dry sterile dressing were applied to the right foot.  Ankle tourniquet was  deflated with immediate return of perfusion to all digits being noted.  I  should not the patient tolerated the procedure and anesthesia well and was  returned from the OR to recovery in satisfactory condition.  She will be  discharged with all written postoperative instructions, prescriptions for  pain medication.  The patient will be  follow postoperatively within the next week in our office.  The patient will  maintain nonweight-bearing in a surgical boot for a period of approximately  three weeks  and will contact the physician if there are any difficulties or  changes.  The patient will be contacted once pathology reports are  available.                                               Alvan Dame, D.P.M.    RS/MEDQ  D:  12/16/2003  T:  12/16/2003  Job:  [id number]

## 2011-01-25 NOTE — Discharge Summary (Signed)
NAMESHAMILA, LERCH              ACCOUNT NO.:  [id number]   MEDICAL RECORD NO.:  [id number]          PATIENT TYPE:  INP   LOCATION:  1529                         FACILITY:  [facility]   PHYSICIAN:  Roseanna Rainbow, M.D.DATE OF BIRTH:  [birth year]   DATE OF ADMISSION:  07/14/2007  DATE OF DISCHARGE:  07/17/2007                               DISCHARGE SUMMARY   CHIEF COMPLAINT:  The patient is a 60 year old para 2 with a complex  adnexal mass who presents for bilateral salpingo-oophorectomy and  possible hysterectomy with staging.  Please see the dictated history and  physical as per Dr. Cleda Mccreedy for further details.   HOSPITAL COURSE:  The patient was admitted.  She underwent a total  abdominal hysterectomy and bilateral salpingo-oophorectomy and umbilical  herniorrhaphy.  Please see the dictated operative summary.  On  postoperative day #1 her hemoglobin was 12.  Her basic metabolic profile  was normal.  She complained of mild nausea.  On postoperative day #2 she  reported flatus and was tolerating a regular diet.  The remainder of her  postoperative course was uneventful.  She was discharged to home on  postoperative day #3.   DISCHARGE DIAGNOSES:  1. Benign mucinous cystadenoma, left ovary.  2. Benign endocervical polyp.  3. Adenomyosis.  4. Umbilical hernia.   PROCEDURES:  Total abdominal hysterectomy, bilateral salpingo-  oophorectomy and umbilical herniorrhaphy.   CONDITION:  Good.   DIET:  Regular.   ACTIVITY:  Progressive activity, pelvic rest.   MEDICATIONS:  Medications included her preoperative medications,  lisinopril, hydrochlorothiazide, minocycline, fish oil, multivitamins  and Zyrtec.  A prescription was given as well for Ultram and over-the-  counter ibuprofen was encouraged.   DISPOSITION:  The patient was to follow up on July 20, 2007 between  10:00 a.m. and 12 noon for staple removal at the GYN oncology office.      Roseanna Rainbow,  M.D.  Electronically Signed     LAJ/MEDQ  D:  08/11/2007  T:  08/11/2007  Job:  [id number]   cc:   Telford Nab, R.N.  [address]  [place], [place] [address]   Donia Guiles, M.D.  Fax: [phone]   Gerald Leitz, MD

## 2011-05-17 ENCOUNTER — Emergency Department (HOSPITAL_COMMUNITY)
Admission: EM | Admit: 2011-05-17 | Discharge: 2011-05-18 | Disposition: A | Discharge status: Home / Self Care | Payer: 59 | Attending: Emergency Medicine | Admitting: Emergency Medicine

## 2011-05-17 DIAGNOSIS — R61 Generalized hyperhidrosis: Secondary | ICD-10-CM | POA: Insufficient documentation

## 2011-05-17 DIAGNOSIS — I10 Essential (primary) hypertension: Secondary | ICD-10-CM | POA: Insufficient documentation

## 2011-05-17 DIAGNOSIS — R0602 Shortness of breath: Secondary | ICD-10-CM | POA: Insufficient documentation

## 2011-05-17 DIAGNOSIS — M79609 Pain in unspecified limb: Secondary | ICD-10-CM | POA: Insufficient documentation

## 2011-05-17 DIAGNOSIS — Z79899 Other long term (current) drug therapy: Secondary | ICD-10-CM | POA: Insufficient documentation

## 2011-05-17 DIAGNOSIS — R252 Cramp and spasm: Secondary | ICD-10-CM | POA: Insufficient documentation

## 2011-05-17 DIAGNOSIS — E119 Type 2 diabetes mellitus without complications: Secondary | ICD-10-CM | POA: Insufficient documentation

## 2011-05-18 ENCOUNTER — Ambulatory Visit (HOSPITAL_COMMUNITY)
Admission: EM | Admit: 2011-05-18 | Discharge: 2011-05-18 | Disposition: A | Discharge status: Home / Self Care | Payer: 59 | Source: Ambulatory Visit | Attending: Emergency Medicine | Admitting: Emergency Medicine

## 2011-05-18 DIAGNOSIS — M79609 Pain in unspecified limb: Secondary | ICD-10-CM

## 2011-05-18 LAB — BASIC METABOLIC PANEL
BUN: 28 mg/dL — ABNORMAL HIGH (ref 6–23)
Calcium: 9 mg/dL (ref 8.4–10.5)
Chloride: 101 mEq/L (ref 96–112)
Creatinine, Ser: 0.83 mg/dL (ref 0.50–1.10)
GFR calc Af Amer: >60 mL/min (ref >60)

## 2011-05-18 LAB — DIFFERENTIAL
Basophils Absolute: 0 10*3/uL (ref 0.0–0.1)
Basophils Relative: 1 % (ref 0–1)
Eosinophils Absolute: 0.5 10*3/uL (ref 0.0–0.7)
Eosinophils Relative: 7 % — ABNORMAL HIGH (ref 0–5)
Lymphocytes Relative: 29 % (ref 12–46)
Monocytes Absolute: 0.6 10*3/uL (ref 0.1–1.0)

## 2011-05-18 LAB — CBC
HCT: 37.7 % (ref 36.0–46.0)
MCHC: 35.3 g/dL (ref 30.0–36.0)
Platelets: 206 10*3/uL (ref 150–400)
RDW: 12.4 % (ref 11.5–15.5)

## 2011-05-18 LAB — PROTIME-INR: INR: 0.84 (ref 0.00–1.49)

## 2011-05-22 ENCOUNTER — Encounter (INDEPENDENT_AMBULATORY_CARE_PROVIDER_SITE_OTHER): Payer: Self-pay | Admitting: Surgery

## 2011-05-30 LAB — CBC
MCHC: 34.2
MCV: 91.2
Platelets: 229

## 2011-05-30 LAB — BASIC METABOLIC PANEL
BUN: 19
CO2: 27
Chloride: 104
Creatinine, Ser: 0.94

## 2011-06-18 LAB — BASIC METABOLIC PANEL
Calcium: 8.7
Creatinine, Ser: 0.83
GFR calc Af Amer: >60
GFR calc non Af Amer: >60

## 2011-06-18 LAB — CBC
RBC: 3.8 — ABNORMAL LOW
WBC: 10.7 — ABNORMAL HIGH

## 2011-06-19 LAB — COMPREHENSIVE METABOLIC PANEL
AST: 35
Albumin: 3.9
Chloride: 101
Creatinine, Ser: 0.78
GFR calc Af Amer: >60
Potassium: 4
Total Bilirubin: 0.7

## 2011-06-19 LAB — DIFFERENTIAL
Basophils Absolute: 0
Eosinophils Relative: 5
Lymphocytes Relative: 29
Monocytes Absolute: 0.3

## 2011-06-19 LAB — PREGNANCY, URINE: Preg Test, Ur: NEGATIVE

## 2011-06-19 LAB — TYPE AND SCREEN
ABO/RH(D): A POS
Antibody Screen: NEGATIVE

## 2011-06-19 LAB — CBC
MCV: 90.5
Platelets: 223
WBC: 6.4

## 2011-07-09 ENCOUNTER — Other Ambulatory Visit: Payer: Self-pay | Admitting: Internal Medicine

## 2011-07-09 DIAGNOSIS — Z1231 Encounter for screening mammogram for malignant neoplasm of breast: Secondary | ICD-10-CM

## 2011-07-22 ENCOUNTER — Encounter (INDEPENDENT_AMBULATORY_CARE_PROVIDER_SITE_OTHER): Payer: Self-pay | Admitting: Surgery

## 2011-07-24 ENCOUNTER — Encounter (INDEPENDENT_AMBULATORY_CARE_PROVIDER_SITE_OTHER): Payer: Self-pay | Admitting: Surgery

## 2011-07-24 ENCOUNTER — Ambulatory Visit (INDEPENDENT_AMBULATORY_CARE_PROVIDER_SITE_OTHER): Payer: Commercial Managed Care - PPO | Admitting: Surgery

## 2011-07-24 VITALS — BP 124/82 | HR 76 | Temp 98.0°F | Resp 12 | Ht 64.0 in | Wt 201.0 lb

## 2011-07-24 DIAGNOSIS — K439 Ventral hernia without obstruction or gangrene: Secondary | ICD-10-CM

## 2011-07-24 NOTE — Patient Instructions (Signed)
To see plastic surgery to consider abdominoplasty at the same time as ventral hernia repair.  Then schedule surgery.  Literature on hernia surgery.

## 2011-07-24 NOTE — Progress Notes (Signed)
[facility]  Susan Kin, MD,  FACS [address] [place]  [address] [place], [place]    [address] Phone:  [phone] FAX:  [phone]  ASSESSMENT AND PLAN: 1.  Recurrent lower abdominal wall hernia.  Though I think it would be great for the patient to get her weight 180, she has not been successful.   She wants to go ahead with surgical repair.  I was her to see plastic surgery for consideration of an abdominoplasty in conjunction with the hernia repair.  I think the removal of her pannus may help in preventing the hernia from recurring.  I discussed the indications and complications of hernia surgery with the patient.  I discussed both the laparoscopic and open approach to hernia repair..  The potential risks of hernia surgery include, but are not limited to, bleeding, infection, open surgery, nerve injury, and recurrence of the hernia.  I provided the patient literature about hernia surgery.  Her surgery be different it that it is recurrent hernia, in her lower abdomen, and will require trying to get the mesh as low as possible.  She understands the indications and potential complications of the surgery.  We also talked about her being out of work for 4 weeks post op.  She wants to schedule this after Christmas, which is fine.  2.  Stage 0 carcinoma of the left breast.  Disease free. 3.  Hypertension. 4.  Non insulin dependent diabetes mellitus. 5.  Hypercholesterolemia. 6.  History of rosacea. 7.  Morbid obesity.  Weight 201.  BMI - 34.5.  Tried to lose weight.  Has increased exercise.  But weight has remained constant.  HISTORY OF PRESENT ILLNESS: Chief Complaint  Patient presents with  . Routine Post Op    PO reck ventral hernia     Susan Montes is a 60 y.o. (DOB: [birth year])  white female who is a patient of Pearson Grippe, MD, MD and comes to me today for abdominal wall hernia. I have tried to fix Susan Montes's hernia twice.  She had a hysterectomy in Nov  2008 for a left ovarian mass.  She had a rapid presentation of the hernia after that surgery.  I tired to fix the ventral hernia first in 10/01/2007.  The hernia failed within 6 months.  I re-repaired the hernia in 10/01/2007.  I used the same mesh to try to fix the hernia.  Again it last 6 months before it failed.  I did a CT scan 08/21/2009 to document the size of the hernia.  We set a goal of Susan Montes losing down below 180 pounds before trying surgery again.  She has lost from the 220's to around 200 pounds, but has not been able to get lower.  She starting jogging this summer and participated in the Women's only run in October.  Still her weight has remained unchanged.  She's here today to talk about going ahead and fixing the ventral hernia.  PHYSICAL EXAM: BP 124/82  Pulse 76  Temp(Src) 98 F (36.7 C) (Temporal)  Resp 12  Ht 5\' 4"  (1.626 m)  Wt 201 lb (91.173 kg)  BMI 34.50 kg/m2  General: WN WF, moderately obese, who is alert and generally healthy appearing.  HEENT: Normal. Pupils equal. Good dentition. Lungs: Clear to auscultation and symmetric breath sounds. Heart:  RRR. No murmur or rub.  Abdomen: Soft. Lower abdominal wall hernia which falls to the RLQ.  Moderate pannus. Rectal: Not done. Extremities:  Good strength and ROM  in upper and lower extremities. Neurologic:  Grossly intact to motor and sensory function. Psychiatric: Has normal mood and affect. Behavior is normal.   DATA REVIEWED: Last CT scan - Dec. 2010.

## 2011-08-13 ENCOUNTER — Ambulatory Visit
Admission: RE | Admit: 2011-08-13 | Discharge: 2011-08-13 | Disposition: A | Discharge status: Home / Self Care | Payer: 59 | Source: Ambulatory Visit | Attending: Internal Medicine | Admitting: Internal Medicine

## 2011-08-13 DIAGNOSIS — Z1231 Encounter for screening mammogram for malignant neoplasm of breast: Secondary | ICD-10-CM

## 2012-01-07 ENCOUNTER — Encounter: Payer: Self-pay | Admitting: Family Medicine

## 2012-01-07 ENCOUNTER — Ambulatory Visit (INDEPENDENT_AMBULATORY_CARE_PROVIDER_SITE_OTHER): Payer: 59 | Admitting: Family Medicine

## 2012-01-07 ENCOUNTER — Ambulatory Visit (HOSPITAL_BASED_OUTPATIENT_CLINIC_OR_DEPARTMENT_OTHER)
Admission: RE | Admit: 2012-01-07 | Discharge: 2012-01-07 | Disposition: A | Discharge status: Home / Self Care | Payer: 59 | Source: Ambulatory Visit | Attending: Family Medicine | Admitting: Family Medicine

## 2012-01-07 VITALS — BP 120/78 | HR 80 | Temp 97.9°F | Ht 64.0 in | Wt 203.0 lb

## 2012-01-07 DIAGNOSIS — S82899A Other fracture of unspecified lower leg, initial encounter for closed fracture: Secondary | ICD-10-CM | POA: Insufficient documentation

## 2012-01-07 DIAGNOSIS — S8263XA Displaced fracture of lateral malleolus of unspecified fibula, initial encounter for closed fracture: Secondary | ICD-10-CM

## 2012-01-07 DIAGNOSIS — S99911A Unspecified injury of right ankle, initial encounter: Secondary | ICD-10-CM | POA: Insufficient documentation

## 2012-01-07 DIAGNOSIS — S8990XA Unspecified injury of unspecified lower leg, initial encounter: Secondary | ICD-10-CM | POA: Insufficient documentation

## 2012-01-07 DIAGNOSIS — X58XXXA Exposure to other specified factors, initial encounter: Secondary | ICD-10-CM | POA: Insufficient documentation

## 2012-01-07 DIAGNOSIS — S99919A Unspecified injury of unspecified ankle, initial encounter: Secondary | ICD-10-CM

## 2012-01-07 DIAGNOSIS — X500XXA Overexertion from strenuous movement or load, initial encounter: Secondary | ICD-10-CM

## 2012-01-07 NOTE — Assessment & Plan Note (Signed)
radiographs show avulsion fracture of distal fibula - in different location from her prior fibula fracture.  To wear cam walker regularly with ace wrap.  After 3 weeks anticipate coming out of this as long as clinically improving to start ROM exercises.  Tylenol, nsaids as needed.  See instructions for further.

## 2012-01-07 NOTE — Patient Instructions (Signed)
You have an avulsion fracture of your distal fibula. Generally this is treated like a severe ankle sprain. Wear the walking boot for at least the next 3 weeks (though possibly up to 6 weeks). Follow up with me in 3 weeks. At that time we'll repeat your exam and possibly start you on some motion exercises. Ok to wear ace wrap under this for compression. Elevate, ice for pain and swelling as well.

## 2012-01-07 NOTE — Progress Notes (Signed)
Subjective:    Patient ID: Susan Montes, female    DOB: Jan 14, 1951, 61 y.o.   MRN: [id number]  PCP: Dr. Seward Carol  HPI 61 yo F here for right foot/ankle injury.  Patient reports she was mowing the yard on 4/28 when she stepped into a hole and inverted right ankle. Able to bear weight right after this but pain, swelling worsened that night. Started wearing cam walker that night, wears this to work past couple days. Taking aleve, tylenol. Prior history of distal fibula fracture. Has been using ACE wrap, icing in evenings.  Past Medical History  Diagnosis Date  . Diabetes mellitus   . Hypertension   . Rosacea   . Hyperlipidemia   . Hemorrhoids   . Ventral hernia   . Cancer 1999    breast- left  . Fatty liver     Current Outpatient Prescriptions on File Prior to Visit  Medication Sig Dispense Refill  . aspirin 81 MG tablet Take 81 mg by mouth daily.        . fish oil-omega-3 fatty acids 1000 MG capsule Take 2 g by mouth daily.        Marland Kitchen lisinopril-hydrochlorothiazide (PRINZIDE,ZESTORETIC) 10-12.5 MG per tablet Take 1 tablet by mouth daily.        . metFORMIN (GLUCOPHAGE) 1000 MG tablet Take 1,000 mg by mouth 2 (two) times daily with a meal.        . MetroNIDAZOLE (METROGEL EX) Apply 1 application topically daily.        . Multiple Vitamin (MULTIVITAMIN PO) Take 1 tablet by mouth daily.          Past Surgical History  Procedure Date  . Abdominal hysterectomy 2008  . Breast surgery 1998    lumpectomies april and may  . Hernia repair 2009, 2010  . Hemangioma excision     right    Allergies  Allergen Reactions  . Anesthetics, Amide     Makes pt vomit    History   Social History  . Marital Status: Married    Spouse Name: N/A    Number of Children: N/A  . Years of Education: N/A   Occupational History  . Not on file.   Social History Main Topics  . Smoking status: Former Games developer  . Smokeless tobacco: Former Neurosurgeon    Quit date: 01/07/1988  . Alcohol Use: Yes    . Drug Use: No  . Sexually Active: Not on file   Other Topics Concern  . Not on file   Social History Narrative  . No narrative on file    Family History  Problem Relation Age of Onset  . Cancer Maternal Grandmother     breast  . Diabetes Father   . Hypertension Father   . Heart attack Neg Hx   . Hyperlipidemia Neg Hx     BP 120/78  Pulse 80  Temp(Src) 97.9 F (36.6 C) (Oral)  Ht 5\' 4"  (1.626 m)  Wt 203 lb (92.08 kg)  BMI 34.84 kg/m2  Review of Systems See HPI above.    Objective:   Physical Exam Gen:NAD  R ankle/foot: Mod lateral ankle swelling.  No bony deformity, bruising, other abnormalities. Mod limitation ROM all directions though able to move in all directions and resist as well. TTP lateral malleolus, less so at ATFL.  No medial malleolus, navicular, base 5th, fibular head or other TTP. Negative ant drawer, 1+ talar tilt. Thompsons test negative. NV intact distally.    Assessment &  Plan:  1. Right ankle injury - radiographs show avulsion fracture of distal fibula - in different location from her prior fibula fracture.  To wear cam walker regularly with ace wrap.  After 3 weeks anticipate coming out of this as long as clinically improving to start ROM exercises.  Tylenol, nsaids as needed.  See instructions for further.

## 2012-01-28 ENCOUNTER — Encounter: Payer: Self-pay | Admitting: Family Medicine

## 2012-01-28 ENCOUNTER — Ambulatory Visit (INDEPENDENT_AMBULATORY_CARE_PROVIDER_SITE_OTHER): Payer: 59 | Admitting: Family Medicine

## 2012-01-28 VITALS — BP 112/75 | HR 87 | Temp 98.4°F | Ht 64.0 in | Wt 200.0 lb

## 2012-01-28 DIAGNOSIS — S99911A Unspecified injury of right ankle, initial encounter: Secondary | ICD-10-CM

## 2012-01-28 DIAGNOSIS — S8990XA Unspecified injury of unspecified lower leg, initial encounter: Secondary | ICD-10-CM

## 2012-01-28 NOTE — Patient Instructions (Signed)
You have an avulsion fracture of your distal fibula. Wear walking boot if going to be on irregular terrain for the next 1-3 weeks (depending on your pain). Start strengthening exercises in 1 week most days of the week for a total of 6 weeks. Start motion exercises now (alphabet, up/down) once a day. Follow up with me as needed. Ice, tylenol/motrin as needed.

## 2012-01-29 ENCOUNTER — Encounter: Payer: Self-pay | Admitting: Family Medicine

## 2012-01-29 NOTE — Assessment & Plan Note (Signed)
avulsion fracture of distal fibula - much improved since last visit.  Continue home ROM exercises, add strengthening in 1 week if tolerated (theraband provided).  Tylenol, nsaids, icing as needed.  To be careful on irregular terrain over next 3 weeks.  Wear comfortable, supportive shoes.  F/u if has any issues.

## 2012-01-29 NOTE — Progress Notes (Signed)
Subjective:    Patient ID: Susan Montes, female    DOB: 1951-02-15, 61 y.o.   MRN: [id number]  PCP: Dr. Seward Carol  HPI  61 yo F here for f/u right foot/ankle injury.  4/30: Patient reports she was mowing the yard on 4/28 when she stepped into a hole and inverted right ankle. Able to bear weight right after this but pain, swelling worsened that night. Started wearing cam walker that night, wears this to work past couple days. Taking aleve, tylenol. Prior history of distal fibula fracture. Has been using ACE wrap, icing in evenings.  5/21: Patient reports she is much improved from last visit. Minimal tenderness to press on the area. No longer using cam walker or ace wrap. Not taking any medications for pain. Icing occasionally. Doing home ROM exercises.  Past Medical History  Diagnosis Date  . Diabetes mellitus   . Hypertension   . Rosacea   . Hyperlipidemia   . Hemorrhoids   . Ventral hernia   . Cancer 1999    breast- left  . Fatty liver     Current Outpatient Prescriptions on File Prior to Visit  Medication Sig Dispense Refill  . aspirin 81 MG tablet Take 81 mg by mouth daily.        . fish oil-omega-3 fatty acids 1000 MG capsule Take 2 g by mouth daily.        Marland Kitchen lisinopril-hydrochlorothiazide (PRINZIDE,ZESTORETIC) 10-12.5 MG per tablet Take 1 tablet by mouth daily.        . metFORMIN (GLUCOPHAGE) 1000 MG tablet Take 1,000 mg by mouth 2 (two) times daily with a meal.        . Multiple Vitamin (MULTIVITAMIN PO) Take 1 tablet by mouth daily.          Past Surgical History  Procedure Date  . Abdominal hysterectomy 2008  . Breast surgery 1998    lumpectomies april and may  . Hernia repair 2009, 2010  . Hemangioma excision     right    Allergies  Allergen Reactions  . Anesthetics, Amide     Makes pt vomit    History   Social History  . Marital Status: Married    Spouse Name: N/A    Number of Children: N/A  . Years of Education: N/A   Occupational  History  . Not on file.   Social History Main Topics  . Smoking status: Former Games developer  . Smokeless tobacco: Former Neurosurgeon    Quit date: 01/07/1988  . Alcohol Use: Yes  . Drug Use: No  . Sexually Active: Not on file   Other Topics Concern  . Not on file   Social History Narrative  . No narrative on file    Family History  Problem Relation Age of Onset  . Cancer Maternal Grandmother     breast  . Diabetes Father   . Hypertension Father   . Heart attack Neg Hx   . Hyperlipidemia Neg Hx     BP 112/75  Pulse 87  Temp(Src) 98.4 F (36.9 C) (Oral)  Ht 5\' 4"  (1.626 m)  Wt 200 lb (90.719 kg)  BMI 34.33 kg/m2  Review of Systems  See HPI above.    Objective:   Physical Exam  Gen:NAD  R ankle/foot: Mild lateral ankle swelling.  No bony deformity, bruising, other abnormalities. FROM and able to resist all ankle motions. Minimal TTP lateral malleolus.  No medial/lateral malleolus, navicular, base 5th, fibular head or other TTP. Negative  ant drawer, 1+ talar tilt. Thompsons test negative. NV intact distally.    Assessment & Plan:  1. Right ankle injury - avulsion fracture of distal fibula - much improved since last visit.  Continue home ROM exercises, add strengthening in 1 week if tolerated (theraband provided).  Tylenol, nsaids, icing as needed.  To be careful on irregular terrain over next 3 weeks.  Wear comfortable, supportive shoes.  F/u if has any issues.

## 2012-07-08 ENCOUNTER — Other Ambulatory Visit: Payer: Self-pay | Admitting: Internal Medicine

## 2012-07-08 DIAGNOSIS — Z1231 Encounter for screening mammogram for malignant neoplasm of breast: Secondary | ICD-10-CM

## 2012-08-14 ENCOUNTER — Ambulatory Visit: Payer: 59

## 2012-08-14 ENCOUNTER — Ambulatory Visit
Admission: RE | Admit: 2012-08-14 | Discharge: 2012-08-14 | Disposition: A | Discharge status: Home / Self Care | Payer: 59 | Source: Ambulatory Visit | Attending: Internal Medicine | Admitting: Internal Medicine

## 2012-08-14 DIAGNOSIS — Z1231 Encounter for screening mammogram for malignant neoplasm of breast: Secondary | ICD-10-CM

## 2012-08-26 ENCOUNTER — Ambulatory Visit (INDEPENDENT_AMBULATORY_CARE_PROVIDER_SITE_OTHER): Payer: 59 | Admitting: Family Medicine

## 2012-08-26 DIAGNOSIS — E119 Type 2 diabetes mellitus without complications: Secondary | ICD-10-CM

## 2012-08-26 NOTE — Progress Notes (Signed)
Patient presents today for 3 month DM follow-up as part of the employer-sponsored Link to Wellness program.  Medications, glucose readings, and lifestyle (diet and exercise) have been reviewed.  Details of this visit may be found in [facility] ([facility] documenting program through [facility]).  Patient has set a series of personal goals and will follow-up in 3 months for further review of DM.

## 2012-09-03 ENCOUNTER — Encounter: Payer: Self-pay | Admitting: Family Medicine

## 2012-09-03 NOTE — Progress Notes (Signed)
Patient ID: Susan Montes, female   DOB: 06/28/1951, 61 y.o.   MRN: [id number] Reviewed: Agree with the documentation and management.

## 2012-10-24 ENCOUNTER — Other Ambulatory Visit: Payer: Self-pay

## 2012-11-22 ENCOUNTER — Emergency Department (HOSPITAL_BASED_OUTPATIENT_CLINIC_OR_DEPARTMENT_OTHER): Payer: 59

## 2012-11-22 ENCOUNTER — Encounter (HOSPITAL_BASED_OUTPATIENT_CLINIC_OR_DEPARTMENT_OTHER): Payer: Self-pay | Admitting: *Deleted

## 2012-11-22 ENCOUNTER — Emergency Department (HOSPITAL_BASED_OUTPATIENT_CLINIC_OR_DEPARTMENT_OTHER)
Admission: EM | Admit: 2012-11-22 | Discharge: 2012-11-23 | Disposition: A | Discharge status: Home / Self Care | Payer: 59 | Attending: Emergency Medicine | Admitting: Emergency Medicine

## 2012-11-22 DIAGNOSIS — Z853 Personal history of malignant neoplasm of breast: Secondary | ICD-10-CM | POA: Insufficient documentation

## 2012-11-22 DIAGNOSIS — Z79899 Other long term (current) drug therapy: Secondary | ICD-10-CM | POA: Insufficient documentation

## 2012-11-22 DIAGNOSIS — Z8639 Personal history of other endocrine, nutritional and metabolic disease: Secondary | ICD-10-CM | POA: Insufficient documentation

## 2012-11-22 DIAGNOSIS — Y9301 Activity, walking, marching and hiking: Secondary | ICD-10-CM | POA: Insufficient documentation

## 2012-11-22 DIAGNOSIS — IMO0002 Reserved for concepts with insufficient information to code with codable children: Secondary | ICD-10-CM | POA: Insufficient documentation

## 2012-11-22 DIAGNOSIS — Z862 Personal history of diseases of the blood and blood-forming organs and certain disorders involving the immune mechanism: Secondary | ICD-10-CM | POA: Insufficient documentation

## 2012-11-22 DIAGNOSIS — Z872 Personal history of diseases of the skin and subcutaneous tissue: Secondary | ICD-10-CM | POA: Insufficient documentation

## 2012-11-22 DIAGNOSIS — T07XXXA Unspecified multiple injuries, initial encounter: Secondary | ICD-10-CM

## 2012-11-22 DIAGNOSIS — I10 Essential (primary) hypertension: Secondary | ICD-10-CM | POA: Insufficient documentation

## 2012-11-22 DIAGNOSIS — Y9289 Other specified places as the place of occurrence of the external cause: Secondary | ICD-10-CM | POA: Insufficient documentation

## 2012-11-22 DIAGNOSIS — R109 Unspecified abdominal pain: Secondary | ICD-10-CM | POA: Insufficient documentation

## 2012-11-22 DIAGNOSIS — Z87891 Personal history of nicotine dependence: Secondary | ICD-10-CM | POA: Insufficient documentation

## 2012-11-22 DIAGNOSIS — R296 Repeated falls: Secondary | ICD-10-CM | POA: Insufficient documentation

## 2012-11-22 DIAGNOSIS — Z8719 Personal history of other diseases of the digestive system: Secondary | ICD-10-CM | POA: Insufficient documentation

## 2012-11-22 DIAGNOSIS — Z8679 Personal history of other diseases of the circulatory system: Secondary | ICD-10-CM | POA: Insufficient documentation

## 2012-11-22 DIAGNOSIS — E119 Type 2 diabetes mellitus without complications: Secondary | ICD-10-CM | POA: Insufficient documentation

## 2012-11-22 MED ORDER — TRAMADOL HCL 50 MG PO TABS
50.0000 mg | ORAL_TABLET | Freq: Once | ORAL | Status: AC
  Administered 2012-11-22: 50 mg via ORAL
  Filled 2012-11-22: qty 1

## 2012-11-22 NOTE — ED Provider Notes (Signed)
History  This chart was scribed for Susan Mooty Smitty Cords, MD by Susan Montes, ED Scribe. The patient was seen in room MH08/MH08. Patient's care was started at 2301.   CSN: [id number]  Arrival date & time 11/22/12  2211   First MD Initiated Contact with Patient 11/22/12 2301      Chief Complaint  Patient presents with  . Fall     Patient is a 62 y.o. female presenting with fall. The history is provided by the patient. No language interpreter was used.  Fall The accident occurred 6 to 12 hours ago. The fall occurred while walking. She fell from a height of 3 to 5 ft. She landed on concrete. The point of impact was the left knee, left hip, left elbow and left shoulder. The pain is moderate. She was ambulatory at the scene. There was no entrapment after the fall. There was no drug use involved in the accident. There was no alcohol use involved in the accident. Associated symptoms include abdominal pain. Pertinent negatives include no numbness, no headaches and no loss of consciousness. Treatments tried: aspirin.    HPI Comments: Susan Montes is a 62 y.o. female who presents to the Emergency Department complaining of mild to moderate, constant left shoulder, left forearm and left hip pain resulting from a fall that occurred 6.5 hours ago. Patient states that she fell in a parking lot while stepping up on a curb at [facility]. She denies head injury, neck pain, back pain or loss of consciousness. Patient states that she took 2 aspirin prior to arrival. Her medical history includes diabetes, hypertension, hyperlipidemia and fatty liver. She does not take aspirin or anti-coagulants regularly.       Past Medical History  Diagnosis Date  . Diabetes mellitus   . Hypertension   . Rosacea   . Hyperlipidemia   . Hemorrhoids   . Ventral hernia   . Cancer 1999    breast- left  . Fatty liver     Past Surgical History  Procedure Laterality Date  . Abdominal hysterectomy  2008  . Breast  surgery  1998    lumpectomies Susan Montes and may  . Hernia repair  2009, 2010  . Hemangioma excision      right    Family History  Problem Relation Age of Onset  . Cancer Maternal Grandmother     breast  . Diabetes Father   . Hypertension Father   . Heart attack Neg Hx   . Hyperlipidemia Neg Hx     History  Substance Use Topics  . Smoking status: Former Games developer  . Smokeless tobacco: Never Used  . Alcohol Use: Yes    OB History   Grav Para Term Preterm Abortions TAB SAB Ect Mult Living                  Review of Systems  HENT: Negative for neck pain.   Gastrointestinal: Positive for abdominal pain.  Musculoskeletal: Negative for back pain.  Neurological: Negative for loss of consciousness, numbness and headaches.  All other systems reviewed and are negative.    Allergies  Anesthetics, amide  Home Medications   Current Outpatient Rx  Name  Route  Sig  Dispense  Refill  . cholecalciferol (VITAMIN D) 1000 UNITS tablet   Oral   Take 1,000 Units by mouth daily.         . Cinnamon 500 MG capsule   Oral   Take 500 mg by mouth daily.         Marland Kitchen  fish oil-omega-3 fatty acids 1000 MG capsule   Oral   Take 2 g by mouth daily.           Marland Kitchen lisinopril-hydrochlorothiazide (PRINZIDE,ZESTORETIC) 10-12.5 MG per tablet   Oral   Take 1 tablet by mouth daily.           . metFORMIN (GLUCOPHAGE) 1000 MG tablet   Oral   Take 1,000 mg by mouth 2 (two) times daily with a meal.           . minocycline (MINOCIN,DYNACIN) 100 MG capsule   Oral   Take 100 mg by mouth 2 (two) times daily.         . Multiple Vitamin (MULTIVITAMIN PO)   Oral   Take 1 tablet by mouth daily.           Marland Kitchen aspirin 81 MG tablet   Oral   Take 81 mg by mouth daily.           Marland Kitchen ezetimibe (ZETIA) 10 MG tablet   Oral   Take 10 mg by mouth daily.         Marland Kitchen levothyroxine (SYNTHROID, LEVOTHROID) 25 MCG tablet   Oral   Take 25 mcg by mouth daily.           Triage Vitals: BP 129/71   Pulse 73  Temp(Src) 97.6 F (36.4 C) (Oral)  Resp 18  Ht 5\' 4"  (1.626 m)  Wt 202 lb (91.627 kg)  BMI 34.66 kg/m2  SpO2 99%  Physical Exam  Constitutional: She is oriented to person, place, and time. She appears well-developed and well-nourished.  HENT:  Head: Normocephalic and atraumatic.  Right Ear: Tympanic membrane normal.  Left Ear: Tympanic membrane normal.  Mouth/Throat: Oropharynx is clear and moist and mucous membranes are normal. Mucous membranes are not dry.  Eyes: Pupils are equal, round, and reactive to light.  Neck: Normal range of motion. Neck supple. No tracheal deviation present.  No cervical tenderness  Cardiovascular: Normal rate, regular rhythm and intact distal pulses.   Pulmonary/Chest: Effort normal and breath sounds normal. She has no wheezes. She has no rales. She exhibits no tenderness.  Abdominal: Soft. Bowel sounds are normal. There is no tenderness.  Musculoskeletal: Normal range of motion. She exhibits no edema.  Full ROM of the left shoulder. Intact biceps tendon. Abrasion on the dorsal aspect of left forearm. 5/5 grip strength. Cap refill less than 2 second. No snuff box tenderness on the left. Ecchymosis to the left hip. No step offs or crepitance. Negative aterior and posterior drawer test. Abrasion to left knee.   Neurological: She is alert and oriented to person, place, and time. She has normal reflexes.  Skin: Skin is warm and dry.  Psychiatric: She has a normal mood and affect.    ED Course  Procedures (including critical care time) DIAGNOSTIC STUDIES: Oxygen Saturation is 99% on room air, normal by my interpretation.    COORDINATION OF CARE: 11:07 PM- Patient informed of current plan for treatment and evaluation and agrees with plan at this time.      Labs Reviewed - No data to display No results found.   No diagnosis found.    MDM  Abrasions.  Will treat pain      I personally performed the services described in this  documentation, which was scribed in my presence. The recorded information has been reviewed and is accurate.    Jasmine Awe, MD 11/23/12 [phone]

## 2012-11-22 NOTE — ED Notes (Signed)
Pt reports she fell in parking lot- bruise to left forearm, left hip pain, bilateral knee pain

## 2012-11-22 NOTE — ED Notes (Signed)
MD at bedside. 

## 2012-11-23 MED ORDER — TRAMADOL HCL 50 MG PO TABS: 50.0000 mg | ORAL_TABLET | Freq: Four times a day (QID) | ORAL | Status: DC | PRN

## 2012-12-01 ENCOUNTER — Ambulatory Visit (INDEPENDENT_AMBULATORY_CARE_PROVIDER_SITE_OTHER): Payer: Self-pay | Admitting: Family Medicine

## 2012-12-01 VITALS — BP 118/68 | Wt 200.0 lb

## 2012-12-01 DIAGNOSIS — E119 Type 2 diabetes mellitus without complications: Secondary | ICD-10-CM

## 2012-12-01 NOTE — Progress Notes (Signed)
Patient presents today for 3 month DM follow-up as part of the employer-sponsored Link to Wellness program. Current DM regimen includes Metformin. She also continues daily ACEi. Patient is no longer on ASA due to increased bruising. Last follow-up with Dr. Selena Batten was this past January. At this time, Zetia and levothyroxine were discontinued due to adverse effects. Levothyroxine caused increased heart rate and hyperglycemia. Since discontinuing, patient's symptoms have resolved and glucose has normalized. Dr. Selena Batten referred pt to cardiologist, Dr. Jacinto Halim for workup. No changes were made. Patient will soon have a 3 month follow-up with Dr. Selena Batten.  Diabetes Assessment: Type of Diabetes: Type 2; MD managing Diabetes Dr. Selena Batten, PCP; hypoglycemia frequency Rarely; A1c - 6.6 (prev 6.9 in Jan 2014) Other Diabetes History: No changes to DM med regimen. Patient continues on Metformin 1000 mg twice daily. Patient was testing daily when having frequent hyperlgycemia. However, this has resolved and patient is now testing only when symptomatic, which is rare. Denies hypoglycemic episodes. Patient did not bring meter today but reports highest reading in 150s. Fasting is generally in 120s but pt is aware it will be higher if she eats pasta the night before. Of note, patient reports some mild neuropathy when glucose is elevated; however, symptoms resolve with activity and exercise. At most recent follow-up with Dr. Selena Batten, A1c was elevated to 6.9 (due to hyperglycemia while on levothyroxine, along with the fact that patient has decreased exercise). At this visit, A1c was done via point-of-care and found to have improved to 6.6. I am hopeful it will continue to improve over the next 3 months.   Lifestyle Factors: Exercise - Continues walking 1-2 days per week if weather is fair. She has been utilizing a walking trail near her home and has also enjoys using the city greenway. Patient and her husband visit the beach frequently during the  summer and are very active while there. They do a lot of boating and biking, etc.  Diet - Patient continues limiting carbs including pasta and potatoes. She enjoys cooking and is working to learn new recipes using leafy greens such as kale, spinach, and swiss chard. She enjoys sweets and recognizes the need to limit these. Patient also understands the importance of portion control and states she generally serves herself half of what she ,"wants".  Assessment: Pt presents today for follow-up. A1c has improved since last follow-up with Dr. Selena Batten and is now down to 6.6 (prev 6.9, Jan 2014). Patient continues working on diet and exercise but has made few additional changes over the past 3 months. Patient will work to maintain exercise regimen and to improve diet through increased leafy green vegetables.   Plan: 1) Continue to exercise at least 1-2 times per week 2) Weight loss goal 5 lbs by June 21st 3) Continue to make healthy dietary choices. Attempt to further limit portions sizes.  4) Follow-up in 3 months on July 1st @ 11:00 am

## 2012-12-08 NOTE — Progress Notes (Signed)
Patient ID: Susan Montes, female   DOB: 09-14-1950, 62 y.o.   MRN: [id number] ATTENDING PHYSICIAN NOTE: I have reviewed the chart and agree with the plan as detailed above. Denny Levy MD Pager [phone]

## 2012-12-09 ENCOUNTER — Other Ambulatory Visit (HOSPITAL_COMMUNITY): Payer: Self-pay | Admitting: Cardiology

## 2012-12-09 DIAGNOSIS — R9431 Abnormal electrocardiogram [ECG] [EKG]: Secondary | ICD-10-CM

## 2012-12-09 DIAGNOSIS — E1059 Type 1 diabetes mellitus with other circulatory complications: Secondary | ICD-10-CM

## 2012-12-09 DIAGNOSIS — I10 Essential (primary) hypertension: Secondary | ICD-10-CM

## 2012-12-16 ENCOUNTER — Ambulatory Visit (HOSPITAL_COMMUNITY): Payer: 59

## 2012-12-21 ENCOUNTER — Ambulatory Visit (HOSPITAL_COMMUNITY): Payer: 59

## 2013-03-09 ENCOUNTER — Ambulatory Visit (INDEPENDENT_AMBULATORY_CARE_PROVIDER_SITE_OTHER): Payer: 59 | Admitting: Family Medicine

## 2013-03-09 VITALS — BP 114/68 | Wt 196.0 lb

## 2013-03-09 DIAGNOSIS — E119 Type 2 diabetes mellitus without complications: Secondary | ICD-10-CM

## 2013-03-09 NOTE — Progress Notes (Signed)
Patient presents today for 3 month diabetes follow-up as part of the employer-sponsored Link to Wellness program. Current diabetes regimen includes Invokana and Kazano. Patient also continues on daily ACEi. Patient is due for a follow-up with MD soon, but cannot recall date of next appt. No major health changes at this time.   DM Assessment: Type of Diabetes: Type 2; Sees Diabetes provider 2 times per year; checks feet daily; MD managing Diabetes Dr. Selena Batten, PCP; uses glucometer; takes medications as prescribed; hypoglycemia frequency Rarely; 30 day CBG average 102; 7 day CBG average 101; 14 day CBG average 101; Other Diabetes History: Current med regimen includes Kazano 12.01/999 mg twice daily and Invokana 100 mg daily. Patient is very pleased with her recent med changes. She is tolerating Invokana well. She reports that metformin-related GI issues are completely resolved upon start of Kazano. Patient does maintain good medication compliance. Patient did bring meter today and is currently testing only 1-2 times per week. Glucose monitoring occurs fasting and when symptomatic. Readings are as follows over the previous month, fasting 99, 103, 105, 108. Hypoglycemia frequency is rare, and generally only occurs during AM exercise. Patient does demonstrate appropriate correction of hypoglycemia. Her BELT program trainer has recommended a morning snack prior to exercise, but patient has a difficult time squeezing this in between thyroid meds and exercise. Patient reports continued signs and symptoms of mild neuropathy but does reports improvements in this. Patient is due for yearly eye exam and will make an appt soon.   Lifestyle Factors: Exercise - Patient is now participating in the BELT program and attends trainer-guided exercise three days per week for an hour each time. She and her husband also stay active during the summer while at the beach.  Diet - Patient continues limiting carbs including pasta and  potatoes. She enjoys cooking and is working to learn new recipes using leafy greens such as kale, spinach, and swiss chard. She enjoys sweets and recognizes the need to limit these. Patient also understands the importance of portion control and states she generally serves herself half of what she "wants". Reports enjoying plenty of fresh vegetables in the summer time.   Assessment: Pt presents today for follow-up. No new A1c, but patient will have this re-tested at next MD appt. Patient continues working on diet and exercise and has recently joined [employer] and exercises three days per week. Patient will work to maintain exercise and dietary regimen and will return in 3 months.  Plan: 1) Continue making healthy dietary exericses.  2) Continue exercising with BELT program! [place] job with weight loss of 5 lbs! 3) Glucose readings look great! 4) Return in 3 months on Tuesday October 7th @ 11:00 am

## 2013-05-31 NOTE — Progress Notes (Signed)
Patient ID: Cielo B Corcino, female   DOB: 01/24/1951, 62 y.o.   MRN: [id number] ATTENDING PHYSICIAN NOTE: I have reviewed the chart and agree with the plan as detailed above. Anndrea Mihelich MD Pager [phone]  

## 2013-06-09 ENCOUNTER — Ambulatory Visit (INDEPENDENT_AMBULATORY_CARE_PROVIDER_SITE_OTHER): Payer: Self-pay | Admitting: Family Medicine

## 2013-06-09 VITALS — BP 110/78 | Wt 197.0 lb

## 2013-06-09 DIAGNOSIS — E119 Type 2 diabetes mellitus without complications: Secondary | ICD-10-CM

## 2013-06-10 NOTE — Progress Notes (Signed)
Patient presents today for 3 month diabetes follow-up as part of the employer-sponsored Link to Wellness program. Current diabetes regimen includes Invokana and Kazano. Patient does not take ASA or statin at this time. 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 Patient is now seeing Dr. Juleen China, endocrinologist, and he will be managing DM and thryoid disorder. Most recent MD follow-up was 06/08/13, at which time A1c was 6.1. Patient has a pending appt with Dr. Juleen China, endo in January and Dr. Selena Batten, PCP in April. No med changes or major health changes at this time.   Diabetes  Assessment: Type of Diabetes: Type 2; Sees Diabetes provider 2 times per year; checks feet daily; MD managing Diabetes Dr. Selena Batten, PCP; uses glucometer; takes medications as prescribed; does not take an aspirin a day; hypoglycemia frequency Rarely; Lowest CBG 65; Highest CBG 126; A1c 6.1, 06/08/13 Other Diabetes History: Current med regimen includes Kazano 12.01/999 mg twice daily and Invokana 100 mg daily. Patient continues to be pleased with her recent med changes. She is tolerating Invokana well. Patient does maintain good medication compliance. Patient did bring meter today and is currently testing only 1-2 times per week. Glucose monitoring occurs fasting and when symptomatic. Glucose readings, per patient report, are 110 or less. Hypoglycemia frequency is rare, and generally only occurs during AM exercise. Patient does demonstrate appropriate correction of hypoglycemia. Patient reports continued signs and symptoms of mild neuropathy but does reports improvements in this. Patient is due for yearly eye exam and will make an appt soon.   Lifestyle Factors: Exercise - Patient continues participating in the BELT program and attends trainer-guided exercise three days per week for an hour each time. She will finish this program this week. She qualifies to  continues in [employer], but work schedule does not allow it. After completion of BELT patient would like to continue exercising 3 days per week and understands the importance of continuing her exercise routine. Patient would like to join aerobic classes at [employer] OR join a gym. She is interested in a line dance class for exercise.  Diet - Patient continues limiting carbs including pasta and potatoes. She enjoys cooking and is working to learn new recipes using leafy greens such as kale, spinach, and swiss chard. She enjoys sweets and recognizes the need to limit these. Patient also understands the importance of portion control and knows this is a constant goal for her. She does admit to splurging on ","brownie brittle"," lately, but knows that she must limit this and that this is a ","dangerous"," food choice.  Assessment: Pt presents today for follow-up with an at-goal A1c of 6.1 per MD office. Patient continues in BELT program, but will complete this soon and has set a goal to continue exercise regimen three days per week. Patient conitnues to maintain a healthy dietary regimen and will return in 3 months.  Plan: 1) Continue to maintain healthy diet and limit portion sizes and sweets 2) Continue to increase vegetables 3) Continue exercising at least three times per week, especially once BELT program ends 4) Continue testing regularly 5) Follow-up in 3 months on Wednesday, January 14th @ 11:00 am

## 2013-07-08 ENCOUNTER — Other Ambulatory Visit (HOSPITAL_COMMUNITY): Payer: 59

## 2013-07-08 ENCOUNTER — Encounter: Payer: 59 | Admitting: Vascular Surgery

## 2013-07-15 ENCOUNTER — Other Ambulatory Visit: Payer: Self-pay

## 2013-07-15 DIAGNOSIS — Z1231 Encounter for screening mammogram for malignant neoplasm of breast: Secondary | ICD-10-CM

## 2013-08-19 ENCOUNTER — Ambulatory Visit
Admission: RE | Admit: 2013-08-19 | Discharge: 2013-08-19 | Disposition: A | Discharge status: Home / Self Care | Payer: 59 | Source: Ambulatory Visit

## 2013-08-19 DIAGNOSIS — Z1231 Encounter for screening mammogram for malignant neoplasm of breast: Secondary | ICD-10-CM

## 2013-09-08 ENCOUNTER — Ambulatory Visit: Payer: 59 | Admitting: Family Medicine

## 2013-09-22 ENCOUNTER — Ambulatory Visit (INDEPENDENT_AMBULATORY_CARE_PROVIDER_SITE_OTHER): Payer: Self-pay | Admitting: Family Medicine

## 2013-09-22 VITALS — BP 108/68 | Wt 195.0 lb

## 2013-09-22 DIAGNOSIS — E119 Type 2 diabetes mellitus without complications: Secondary | ICD-10-CM

## 2013-09-22 NOTE — Progress Notes (Signed)
Patient presents today for 3 month diabetes follow-up as part of the employer-sponsored Link to Wellness program. Current diabetes regimen includes Kazano and Invokana. Patient also continues on daily ASA. Most recent MD follow-up was Jan 2015 with Dr. Wilson Singer. Patient has a pending appt for April 2015. Patient has added tumeric supplement to medications. No prescription med changes or major health changes at this time.   Diabetes Assessment: Type of Diabetes: Type 2; Sees Diabetes provider 2 times per year; checks feet daily; MD managing Diabetes Dr. Wilson Singer, endo; uses glucometer; takes medications as prescribed; hypoglycemia frequency Rare; Lowest CBG 65; Highest CBG 126; checks blood glucose 1-2 times a week; takes an aspirin a day; A1c 5.3 (prev 6.1) Other Diabetes History: Current med regimen includes Kazano 12.01/999 mg twice daily and Invokana 100 mg daily. She has also added tumeric supplement daily. This was added because patient noticed every time she cooked mediteranean dishes including tumeric spice, her glucose was always good. She has added tumeric daily as a supplement now and A1c has decreased from 6.1 to 5.3. It is uncertain whether this decrease is entirely due to tumeric but it is reasonable to believe there has been a positive effect. She continues tolerating medications well. Patient does maintain good medication compliance. Patient did not bring meter today but is currently testing 1-2 times per week. Glucose monitoring occurs fasting and when symptomatic. Glucose readings, per patient report, are 110-115 fasting and <100 non-fasting. Hypoglycemia frequency is rare, and generally only occurs during AM exercise. Patient does demonstrate appropriate correction of hypoglycemia. Patient reports continued signs and symptoms of mild neuropathy but does reports improvements in this with improvement in A1c. Patient is due for yearly eye exam and will make an appt soon.   Lifestyle Factors: Exercise  - Patient admits that exercise has deteriorated recently. She has joined a gym but has not yet used her membership. [employer] program has ended and since this time pateint has not maintained her exercise regimen of three days per week. She understands the importance and would like to resume soon.  Diet - Patient continues limiting carbs including pasta and potatoes. She enjoys cooking and is working to learn new recipes using leafy greens such as kale, spinach, and swiss chard. She enjoys sweets and recognizes the need to limit these. Patient also understands the importance of portion control and knows this is a constant goal for her.   Assessment: Pt presents today for follow-up with an at-goal and further improved A1c of 5.3 (prev 6.1) per MD office. Patient has allowed exercise to deteriorate, but has set a goal to get back on track with exercise regimen three days per week. Patient conitnues to maintain a healthy diet and will return in 3 months.  Plan: 1) Continue to make healthy dietary choices 2) Resume exercise three times per week 3) Schedule an eye exam 4) Continue testing regularly 5) Follow-up in three months on April 15th @ 11:00 am

## 2013-12-22 ENCOUNTER — Ambulatory Visit (INDEPENDENT_AMBULATORY_CARE_PROVIDER_SITE_OTHER): Payer: Self-pay | Admitting: Family Medicine

## 2013-12-22 VITALS — BP 118/68 | Wt 198.0 lb

## 2013-12-22 DIAGNOSIS — E119 Type 2 diabetes mellitus without complications: Secondary | ICD-10-CM

## 2013-12-22 NOTE — Progress Notes (Signed)
Patient presents today for 3 month diabetes follow-up as part of the employer-sponsored Link to Wellness program. Current diabetes regimen includes Kazano (Metformin and Alogliptin). Patient also continues on daily ASA. Most recent MD follow-up with endocrinologist was Jan 2014. Patient has a pending appt for end April with [facility] (3 month follow-up). No med changes or major health changes at this time.   Diabetes Assessment: Type of Diabetes: Type 2; Sees Diabetes provider 2 times per year; checks feet daily; MD managing Diabetes Dr. Maudie Mercury, PCP; uses glucometer; takes medications as prescribed; hypoglycemia frequency Rarely; checks blood glucose 1-2 times a week; takes an aspirin a day; Lowest CBG 85; Highest CBG 115 Other Diabetes History: Current med regimen includes Kazano 12.01/999 mg twice daily and Invokana 100 mg daily. She continues on tumeric supplement daily. This was added because patient noticed every time she cooked mediteranean dishes including tumeric spice, her glucose was always good. She recognizes that tumeric helps better control her glucose, improves the health of her skin (less frequent eczema flares), and aids in would healing. She continues tolerating medications well. Patient does maintain good medication compliance. Patient did not bring meter today but is currently testing 1-2 times per week. Glucose monitoring occurs fasting and when symptomatic. Glucose readings, per patient report, are 95-120. Hypoglycemia frequency is rare, with only one event of glucose 85 recently (pt experienced a headache). Patient does demonstrate appropriate correction of hypoglycemia. Patient reports continued signs and symptoms of mild neuropathy but does reports improvements in this with improvement in A1c. She reports symptoms are more absent than present at this time. Patient is due for yearly eye exam and will make an appt soon.   Lifestyle Factors: Exercise - Patient has increased exercise, but  continues to have room for improvement. She is now attending the [facility] and participating in group exercises twice weekly. This center is for ages 4 and over and is free of charge. There are a variety of activities offered here including weight room, open gym, pool/water aerobics, line dancing, zumba, etc. In addition she has a new puppy and is walking her often. Patient understands the importance of regular exercise.  Diet - Patient continues limiting carbs including pasta and potatoes. She enjoys cooking and is working to learn new recipes using leafy greens such as kale, spinach, and swiss chard. She enjoys sweets and recognizes the need to limit these. Patient also understands the importance of portion control and knows this is a constant goal for her. She does not wish to set any new goals at this time.   Assessment: Pt presents today for follow-up. A1c was not drawn at this meeting but will be drawn at upcoming MD appt. Patient has made small improvements to exercise but will continue to work toward this. Patient conitnues to maintain a healthy diet and will return in 3 months.  Plan: 1) Continue making healthy dietary choices 2) Continue to stay active, continue exercising at least twice per week, more if able 3) Continue testing 4) Follow-up with MD in late April 5) Follow-up with Link to Wellness in 3 months on Wednesday April 15th @ 11:00 am

## 2013-12-30 NOTE — Progress Notes (Signed)
Patient ID: Susan Montes, female   DOB: 10/05/1950, 62 y.o.   MRN: [id number] ATTENDING PHYSICIAN NOTE: I have reviewed the chart and agree with the plan as detailed above. Bosten Newstrom MD Pager [phone]  

## 2014-01-03 NOTE — Progress Notes (Signed)
Patient ID: Susan Montes, female   DOB: 06/21/1951, 63 y.o.   MRN: [id number] ATTENDING PHYSICIAN NOTE: I have reviewed the chart and agree with the plan as detailed above. Dorcas Mcmurray MD Pager [phone]

## 2014-03-23 ENCOUNTER — Ambulatory Visit (INDEPENDENT_AMBULATORY_CARE_PROVIDER_SITE_OTHER): Payer: Self-pay | Admitting: Family Medicine

## 2014-03-23 VITALS — BP 108/64 | Wt 196.0 lb

## 2014-03-23 DIAGNOSIS — E119 Type 2 diabetes mellitus without complications: Secondary | ICD-10-CM

## 2014-03-24 NOTE — Progress Notes (Signed)
Patient presents today for 3 month diabetes follow-up as part of the employer-sponsored Link to Wellness program. Current diabetes regimen includes Kazano and Invokana. Patient is not currently taking ASA, ACEi, or statin. Most recent MD follow-up was early July with PCP Dr. Maudie Mercury. Patient has a pending appt for follow-up with Dr. Wilson Singer, endo. No med changes or major health changes at this time.  Diabetes Assessment: Type of Diabetes: Type 2; Sees Diabetes provider 2 times per year; checks feet daily; MD managing Diabetes Dr. Maudie Mercury, PCP; uses glucometer; takes medications as prescribed; hypoglycemia frequency Rarely; takes an aspirin a day; Lowest CBG 85; Highest CBG 115; 30 day CBG average 116; checks blood glucose less than 1 time a week  Other Diabetes History: Current med regimen includes Kazano 12.01/999 mg twice daily and Invokana 100 mg daily. She continues on tumeric supplement daily. This was added because patient noticed every time she cooked mediteranean dishes including tumeric spice, her glucose was always good. She recognizes that tumeric helps better control her glucose, improves the health of her skin (less frequent eczema flares), and aids in would healing. She continues tolerating medications well. Patient does maintain good medication compliance. Patient did bring meter today and is testing only on occassion, less than once per week. Glucose monitoring occurs fasting and when symptomatic. Glucose readings, though sparse, are generally less than 130. Hypoglycemia frequency is rare. Patient does demonstrate appropriate correction of hypoglycemia. Patient is due for yearly eye exam and will make an appt soon. Feet and toes are clear with no issues with infection or wound healing. A1c continues to increase and is now 6.4. I am hopeful patient can better manage diet and exercise and reduce A1c to <6 as it has been in the past.  Lifestyle Factors: Exercise - Patient is not participating in routine  exercise. She was using the [facility] exercise facilities; however, she finds it difficult to fit this into her schedule and has stopped going. She and her husband continue boating (sail boat) on the weekends at the beach. Pt reports that she is much more active while at the beach vs while at home. Patient reports she feels much better when she is more active, but is struggling with consistency at this time.  Diet - Patient has struggled to maintain a healthy diet. She enjoys icecream on a regular basis during the summer months and admits to eating more fast foods and unhealthy foods since she has been traveling many weekends this summer to the beach. She knows she can make better choices, and will work on these things over the next three months.   Assessment: Pt presents today for follow-up. A1c was not drawn at this meeting as it was recently drawn at MD appt and had increased to 6.4. Patient has allowed both diet and exercise to deteriorate and will work to improve this over the coming months. Patient will return in 3 months.  Plan: 1) Continue to make healthy dietary choices and attempt to limit sweets and carbs 2) Attempt to increase exercise, stay as active as possible 3) Increase testing to at least twice per week 4) Follow-up in 3 months on October 14th @ 11:00 am

## 2014-05-10 ENCOUNTER — Encounter: Payer: Self-pay | Admitting: Family Medicine

## 2014-05-10 NOTE — Progress Notes (Signed)
Patient ID: Susan Montes, female   DOB: May 27, 1951, 63 y.o.   MRN: [id number] Reviewed: Agree with the documentation and management of our [facility] pharmacologist.

## 2014-07-06 ENCOUNTER — Ambulatory Visit (INDEPENDENT_AMBULATORY_CARE_PROVIDER_SITE_OTHER): Payer: Self-pay | Admitting: Family Medicine

## 2014-07-06 ENCOUNTER — Encounter: Payer: Self-pay | Admitting: Family Medicine

## 2014-07-06 VITALS — BP 116/68 | Wt 198.0 lb

## 2014-07-06 DIAGNOSIS — E119 Type 2 diabetes mellitus without complications: Secondary | ICD-10-CM

## 2014-07-06 NOTE — Progress Notes (Signed)
Patient ID: Susan Montes, female   DOB: 01/13/1951, 63 y.o.   MRN: [id number] Reviewed: Agree with the documentation and management of our [place] Pharmacologist. 

## 2014-07-12 NOTE — Progress Notes (Signed)
Patient presents today for 3 month diabetes follow-up as part of the employer-sponsored Link to Wellness program. Current diabetes regimen includes Kazano and Invokana. Patient is not currently taking ASA, ACEi, or statin. Most recent MD follow-up was early July with PCP Dr. Maudie Mercury. Patient has a pending appt for follow-up with Dr. Wilson Singer, endo. She will follow-up with Dr. Maudie Mercury next week for [facility] and routine exam. No med changes or major health changes at this time.  Diabetes Assessment: Type of Diabetes: Type 2; Sees Diabetes provider 2 times per year; checks feet daily; MD managing Diabetes Dr. Maudie Mercury, PCP; uses glucometer; takes medications as prescribed; hypoglycemia frequency Rarely; takes an aspirin a day; 30 day CBG average 116; checks blood glucose less than 1 time a week; Lowest CBG 70; Highest CBG 174; Other Diabetes History: Current med regimen includes Kazano 12.01/999 mg twice daily and Invokana 100 mg daily. She continues on tumeric supplement daily. She continues tolerating medications well. Patient does maintain good medication compliance. Patient did not bring meter today and reports testing less than once per week. Glucose monitoring occurs fasting and when symptomatic. Glucose readings, though sparse, are generally less than 130. Hypoglycemia frequency is rare, patient does report one episode of glucose in the 70s. Highest reading has been 174 but this was taken shortly after a meal. Patient does demonstrate appropriate correction of hypoglycemia. Patient is due for yearly eye exam and will make an appt soon. Feet and toes are clear with no issues with infection or wound healing. However, she reports some intermittent neuropathy in both feet at night, described as tingling and numbness. Patient is due for eye exam and will schedule apt soon.  Lifestyle Factors: Exercise - Intermittent but not routine. Trying to walk some daily. Pt reports that she is much more active while at the beach vs while  at home, has not traveled as much lately after season changes. Patient reports she feels much better when she is more active, but is struggling with consistency at this time.  Diet - Patient has struggled to maintain a healthy diet. She knows she can make better choices, and will work on these things over the next three months. Of note, she has tried to eat more fish lately as a source of lean protein, and has limited sweets and treats lately. She is also being more aware of portion sizes recently.   Assessment: Pt presents today for follow-up. A1c was not drawn at this meeting as it was recently drawn at MD appt and had increased to 6.4. Patient has made small improvements to both diet and exercise but continues to set goals and will work toward these over the coming months. Patient will return in 3 months.  Plan: 1) Continue to make healthy dietary choices and attempt to limit sweets and carbs 2) Attempt to increase exercise and improve consistency 3) Continue testing regularly 4) Follow-up in 3 months on Wednesday Jan 27th @ 11:00 am

## 2014-07-22 ENCOUNTER — Other Ambulatory Visit: Payer: Self-pay

## 2014-07-22 DIAGNOSIS — Z1231 Encounter for screening mammogram for malignant neoplasm of breast: Secondary | ICD-10-CM

## 2014-07-26 NOTE — Progress Notes (Signed)
Patient ID: Susan Montes, female   DOB: 01/21/1951, 63 y.o.   MRN: [id number] Reviewed: Agree with the documentation and management of our [place] Pharmacologist. 

## 2014-08-22 ENCOUNTER — Ambulatory Visit
Admission: RE | Admit: 2014-08-22 | Discharge: 2014-08-22 | Disposition: A | Discharge status: Home / Self Care | Payer: 59 | Source: Ambulatory Visit

## 2014-08-22 DIAGNOSIS — Z1231 Encounter for screening mammogram for malignant neoplasm of breast: Secondary | ICD-10-CM

## 2014-10-27 ENCOUNTER — Ambulatory Visit (INDEPENDENT_AMBULATORY_CARE_PROVIDER_SITE_OTHER): Payer: Self-pay | Admitting: Family Medicine

## 2014-10-27 VITALS — BP 122/60 | Ht 64.0 in | Wt 198.0 lb

## 2014-10-27 DIAGNOSIS — E119 Type 2 diabetes mellitus without complications: Secondary | ICD-10-CM

## 2014-10-27 NOTE — Progress Notes (Signed)
Patient presents today for 3 month diabetes follow-up as part of the employer-sponsored Link to Wellness program. Current diabetes regimen includes Kazano (alogliptin and metformin). Patient is not currently taking ASA, ACEi, or statin. Most recent MD follow-up was fall 2015 with endo, Dr. Wilson Singer. Patient has a pending appt for follow-up with Dr. Wilson Singer in about 3 months. Patient discontinued invokana at most recent MD visit due to difficult yeast infection. She does not wish to resume. Tumeric was also increased to 2000 mg per day. A1c at recent visit was 6.1. No major health changes at this time.  Diabetes Assessment: Type of Diabetes: Type 2; Sees Diabetes provider 2 times per year; checks feet daily; MD managing Diabetes Dr. Maudie Mercury, PCP; uses glucometer; takes medications as prescribed; hypoglycemia frequency Rarely; takes an aspirin a day; checks blood glucose less than 1 time a week  ; 30 day CBG average 126; 7 day CBG average 126; 14 day CBG average 126; Highest CBG 132; Lowest CBG 77; Other Diabetes History: Current med regimen includes Kazano 12.01/999 mg twice daily. Patient recently discontinued Invokana due to severe yeast infection. She continues on tumeric supplement daily and has recently doubled this dose to 2000 mg daily. Patient does maintain good medication compliance. Patient did bring meter today and is testing several times per week, fasting. Glucose monitoring occurs fasting and when symptomatic. Glucose readings are generally 130s or less with range of 77-132. Patient has had more fasting readings >130 recently. Hypoglycemia frequency is rare. Patient does demonstrate appropriate correction of hypoglycemia. Patient is due for yearly eye exam and will make an appt soon. Feet and toes are clear with no issues with infection or wound healing.   Lifestyle Factors: Exercise - Very little routine exercise at this time due to recent stressful family situation after cancer diagnosis and passing of  patients father-in-law. She did purchase a Size Right dance video, and has been doing this for 15 minutes two times per week.  Diet - Due to high stress, patient's diet has suffered. She will attempt to get back on track.   Plan: 1) Continue to make healthy dietary choices and attempt to limit sweets and carbs 2) Continue exercising at least twice per week 3) Continue testing regularly 4) Schedule an eye exam 4) Follow-up in 3 months on Thursday May 19th @ 11:00 am

## 2014-11-10 NOTE — Progress Notes (Signed)
Patient ID: Susan Montes, female   DOB: [birth year], 64 y.o.   MRN: [id number] Reviewed: Agree with the documentation and management of our [place].

## 2014-11-18 ENCOUNTER — Ambulatory Visit (INDEPENDENT_AMBULATORY_CARE_PROVIDER_SITE_OTHER): Payer: 59 | Admitting: Family Medicine

## 2014-11-18 ENCOUNTER — Encounter: Payer: Self-pay | Admitting: Family Medicine

## 2014-11-18 VITALS — BP 126/74 | HR 65 | Ht 64.0 in | Wt 198.0 lb

## 2014-11-18 DIAGNOSIS — M25569 Pain in unspecified knee: Secondary | ICD-10-CM

## 2014-11-18 DIAGNOSIS — M25561 Pain in right knee: Secondary | ICD-10-CM

## 2014-11-18 DIAGNOSIS — G8929 Other chronic pain: Secondary | ICD-10-CM

## 2014-11-18 DIAGNOSIS — M7120 Synovial cyst of popliteal space [Baker], unspecified knee: Secondary | ICD-10-CM | POA: Insufficient documentation

## 2014-11-18 DIAGNOSIS — M7121 Synovial cyst of popliteal space [Baker], right knee: Secondary | ICD-10-CM

## 2014-11-18 MED ORDER — METHYLPREDNISOLONE ACETATE 40 MG/ML IJ SUSP
40.0000 mg | Freq: Once | INTRAMUSCULAR | Status: AC
  Administered 2014-11-18: 40 mg via INTRA_ARTICULAR

## 2014-11-18 NOTE — Progress Notes (Signed)
[place] Attending Note:  The plan of right knee pain for the last couple of months. Pain is on the lateral side and posterior portion of the knee. Hurts to bend, bear weight and climb stairs. It is pressure sensation. 6 out of 10. Feels like at times when she's going downhill the knee wants to give way. She's been taking some ibuprofen with minimal relief.  Objective: Well-developed female no acute distress KNEE: Ligamentously intact to varus and valgus stress. Popliteal space is slightly full. Calf is soft. Normal Lockman and anterior drawer. SKIN: There is no ecchymoses or erythema. No warmth of the joint. VASCULAR: Distally her cells pedis pulses are 2+ bilaterally equal NEURO: Intact sensation soft touch bilateral feet.  I performed her ultrasound with special care taken to the popliteal space. 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 Initially it seemed that this might becontiguous with some vascular structures but on further inspection in a prone position I was able to determine that her vasculature is separate from the Baker's cyst. This was quite obvious on Doppler examination.  We discussed options. 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 I don't think aspirating the small amount of fluid in the Baker's cyst would significantly improve her situation and given the very close proximity of her vasculature and don't think the risk-benefit ratio is worth the attempt. I do think corticosteroid injection into the knee joint would be potentially useful and we discussed that at length. We did that today and if she's not improving she'll let us know. The only other path for like it's he would be MRI and potentially arthroscopic surgery and she's not really excited about entertaining the idea of surgery at this point. She'll let us know if she changes her mind or if this gets to the point where it's aggravating enough to  pursue that line. I asked her to give [place]: Week and let us know if she's had improvement.  INJECTION: Patient was given informed consent, signed copy in the chart. Appropriate time out was taken. Area prepped and draped in usual sterile fashion. 1 cc of methylprednisolone 40 mg/ml plus  4 cc of 1% lidocaine without epinephrine was injected into the right knee joint using a(n) anterior medial approach. The patient tolerated the procedure well. There were no complications. Post procedure instructions were given.

## 2015-01-26 ENCOUNTER — Ambulatory Visit: Payer: 59 | Admitting: Pharmacist

## 2015-01-26 ENCOUNTER — Ambulatory Visit (INDEPENDENT_AMBULATORY_CARE_PROVIDER_SITE_OTHER): Payer: Self-pay | Admitting: Family Medicine

## 2015-01-26 VITALS — BP 122/72 | Ht 64.0 in | Wt 199.0 lb

## 2015-01-26 DIAGNOSIS — E119 Type 2 diabetes mellitus without complications: Secondary | ICD-10-CM

## 2015-01-26 NOTE — Patient Instructions (Addendum)
1)  Continue making healthy dietary choices 2)  Exercise - goal of 7500 steps per day, walk dog more often 3)  Schedule an eye exam 4)  Follow-up in 3 months on Thursday, August 18th @ 11:00 am  Great to see you, enjoy your summer!

## 2015-01-26 NOTE — Progress Notes (Signed)
Subjective:  Patient is a 64 yo female with type 2 diabetes who presents today for 3 month follow-up as part of the employer-sponsored Link to Wellness program. Current diabetes regimen includes Kazano (alogliptin/metformin), and Invokana. Patient does not take a daily ASA, ACEi, or statin. Most recent MD follow-up was April 2016. Patient has a pending appt for 6 mo follow-up. No med changes or major health changes at this time.  Diabetes Assessment:  No changes to diabetes regimen at this time. Patient does maintain good medication compliance. Most recent A1c was 6.1% which is at goal of less than 7%.  Weight remains unchanged since last visit.  Patient did bring meter today and is currently testing only when symptomatic. Glucose readings for previous three months are 128 135 145 112.   Hypoglycemia is rare. Patient does demonstrate appropriate correction of hypoglycemia when needed.  Patient reports signs and symptoms of neuropathy including numbness/tingling/burning but no symptoms of infection.  Patient is not up to date on eye exam but is up to date on dental exam.      Lifestyle Assessment:  [place]ericse - Walks dog three days per week and wears pedometer avg 6000 steps per day.    Diet - Feels diet is under good control, eating more fresh fruit/veg as they are now in season, portion sizes are controled, pt overall attempts to make healthy choices.  Does not wish to set any specific goals at this time.   Plan and Goals: 1)  Continue making healthy dietary choices 2)  Exercise - goal of 7500 steps per day, walk dog more often 3)  Schedule an eye exam 4)  Follow-up in 3 months on Thursday, August 18th @ 11:00 am   Tilman Neat, PharmD Link to [place]  [phone]

## 2015-02-01 NOTE — Progress Notes (Signed)
ATTENDING PHYSICIAN NOTE: I have reviewed the chart and agree with the plan as detailed above. Kelyn Koskela MD Pager [phone]  

## 2015-02-14 ENCOUNTER — Encounter: Payer: Self-pay | Admitting: Pharmacist

## 2015-02-24 ENCOUNTER — Ambulatory Visit (INDEPENDENT_AMBULATORY_CARE_PROVIDER_SITE_OTHER): Payer: 59 | Admitting: Family Medicine

## 2015-02-24 ENCOUNTER — Encounter: Payer: Self-pay | Admitting: Family Medicine

## 2015-02-24 VITALS — BP 123/69 | HR 75 | Ht 64.0 in | Wt 200.0 lb

## 2015-02-24 DIAGNOSIS — M25569 Pain in unspecified knee: Secondary | ICD-10-CM

## 2015-02-24 DIAGNOSIS — G8929 Other chronic pain: Secondary | ICD-10-CM

## 2015-02-24 MED ORDER — METHYLPREDNISOLONE ACETATE 40 MG/ML IJ SUSP
40.0000 mg | Freq: Once | INTRAMUSCULAR | Status: AC
  Administered 2015-02-24: 40 mg via INTRA_ARTICULAR

## 2015-02-24 NOTE — Patient Instructions (Signed)
Hopefully this injection in your knee will work just as well if not better than the last one. If it doesn't, come back and see me. I hope you have a fabulous time on your vacation in [place] !

## 2015-02-25 NOTE — Assessment & Plan Note (Addendum)
Initially presented with balers cyst that was drainedand had  CSI #1 11/2014 with excellent relief for 3 m, Today we did  CSI #2 . 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 Discussed recurrent CSI knee. Right now she received excellent benefit from 1st one, let's hope this continues. DM well controlled currently. (Not on ASA?) DM followed by PCP. Hx ankle injury on Right

## 2015-02-25 NOTE — Progress Notes (Addendum)
Patient ID: Susan Montes, female   DOB: Nov 27, 1950, 64 y.o.   MRN: [id number]  Susan Montes - 64 y.o. female MRN [id number]  Date of birth: 19-Apr-1951    SUBJECTIVE:     Right  Knee pain---had done really well afte last CSI in early March with 90% improvement initially--over last 2-3 weeks back to baseline. Getting ready to goon vacation to [place]--wants to be able todo a lot of walking--her son who lives there is taking them to all kinds of places requiring walking. Has otherwise been doing well. Left knee does not bother her much at all. ROS:     Increased pain and stiffness in left knee as per HPI Has not noted any other unusual arthralgias, no rdness or swelling of left knee or other joints. No unusual fatigue.,Other Pertinent review of systems: negative for fever or unusual weight change.   PERTINENT  PMH / PSH FH / / SH:  Past Medical, Surgical, Social, and Family History Reviewed & Updated in the EMR.  Pertinent findings include:  DM well controlled w last A1C < 7 No hx left knee surgery2  OBJECTIVE: BP 123/69 mmHg  Pulse 75  Ht 5\' 4"  (1.626 m)  Wt 200 lb (90.719 kg)  BMI 34.31 kg/m2  Physical Exam:  Vital signs are reviewed. WD WN NAD KNEERIGHT: some stiffness last 10-20 degrees of extension but FROM. Ligamentously intact to varus and valgus. Smallamount synovial hypertrophy on palpation vs very small effusion (I favor hypertrophy). Popliteal space iss symmetrical with that on the left (sligtly full) but not tender and no overt mass noted.Calf is soft. VMO development, muscle bulk and tone symmetrical and normal. Mild pain with patellar grind. Normal lachman.  VASC DP pulses 2+ B= SKIN around knee and lower extremity on right  is without lesions or rash, normal colow. NEURO Intact sensation to soft touch LLE.  INJECTION: Patient was given informed consent, signed copy in the chart. Appropriate time out was taken. Area prepped and draped in usual sterile fashion. 1cc of  methylprednisolone 40 mg/ml plus  4cc of 1% lidocaine without epinephrine was injected into the RIGHT KNEE using an anterior medial approach. He patient twell. There were no complications. Post procedure instructions were given.   ASSESSMENT & PLAN:  See problem based charting & AVS for pt instructions. 2

## 2015-02-27 ENCOUNTER — Ambulatory Visit (HOSPITAL_COMMUNITY)
Admission: RE | Admit: 2015-02-27 | Discharge: 2015-02-27 | Disposition: A | Discharge status: Home / Self Care | Payer: 59 | Source: Ambulatory Visit | Attending: Family Medicine | Admitting: Family Medicine

## 2015-02-27 DIAGNOSIS — M25562 Pain in left knee: Secondary | ICD-10-CM | POA: Diagnosis not present

## 2015-02-27 DIAGNOSIS — M25561 Pain in right knee: Secondary | ICD-10-CM | POA: Insufficient documentation

## 2015-02-27 DIAGNOSIS — M25569 Pain in unspecified knee: Secondary | ICD-10-CM

## 2015-02-27 DIAGNOSIS — M8580 Other specified disorders of bone density and structure, unspecified site: Secondary | ICD-10-CM | POA: Diagnosis not present

## 2015-02-27 DIAGNOSIS — G8929 Other chronic pain: Secondary | ICD-10-CM

## 2015-03-06 ENCOUNTER — Encounter: Payer: Self-pay | Admitting: Family Medicine

## 2015-03-16 ENCOUNTER — Encounter: Payer: Self-pay | Admitting: Family Medicine

## 2015-04-27 ENCOUNTER — Encounter: Payer: Self-pay | Admitting: Pharmacist

## 2015-04-27 ENCOUNTER — Ambulatory Visit: Payer: 59 | Admitting: Pharmacist

## 2015-04-27 ENCOUNTER — Ambulatory Visit (INDEPENDENT_AMBULATORY_CARE_PROVIDER_SITE_OTHER): Payer: Self-pay | Admitting: Family Medicine

## 2015-04-27 VITALS — BP 120/74 | Wt 200.0 lb

## 2015-04-27 DIAGNOSIS — E119 Type 2 diabetes mellitus without complications: Secondary | ICD-10-CM

## 2015-04-27 NOTE — Progress Notes (Signed)
Subjective:  Patient is a 64 yo female with type 2 diabetes who presents today for 3 month follow-up as part of the employer-sponsored Link to Wellness program. Current diabetes regimen includes Kazano (alogliptin/metformin), and Invokana. Patient does not take a daily ASA, ACEi, or statin. Most recent MD follow-up was April 2016. Patient has a pending appt for 6 mo follow-up in October 2016 with [facility].  Appt for physical exam with PCP in Sept. No med changes or major health changes at this time.  Diabetes Assessment:  No changes to diabetes regimen at this time. Patient does maintain good medication compliance. Most recent A1c was 6.1% which is at goal of less than 7%.  A1c will be checked at upcoming appt in Sept.  Weight remains unchanged since last visit.  Patient did bring meter today and is currently testing only when symptomatic, and on occasion 2-3 times per month.   Hypoglycemia is rare. Patient does demonstrate appropriate correction of hypoglycemia when needed.  Some mild signs of neuropathy, intermittent numbness and tingling, mostly at night.  Patient is up to date on eye exam and dental exam.       Lifestyle Assessment:  Exericse - Walks dog three days per week and wears pedometer avg 6000 steps per day.  She recently won three meetings with Physiological scientist, she will attend once weekly.   Diet - Feels diet is under good control, eating more fresh fruit/veg as they are now in season, portion sizes are controled, pt overall attempts to make healthy choices.  Does not wish to set any specific goals at this time.   Plan and Goals: 1)  Continue making healthy dietary choices 2)  Exercise - goal of 7500 steps per day, walk dog more often, especially once weather cools 3)  Follow-up in 3 months on Thursday, August 18th @ 11:00 am   Tilman Neat, PharmD Link to [place]  [phone]

## 2015-04-27 NOTE — Patient Instructions (Signed)
1)  Continue making healthy dietary choices 2)  Exercise - goal of 7500 steps per day, walk dog more often, especially once weather cools 3)  Follow-up in 3 months on Thursday, August 18th @ 11:00 am  Great to see you today!

## 2015-05-17 NOTE — Progress Notes (Signed)
ATTENDING PHYSICIAN NOTE: I have reviewed the chart and agree with the plan as detailed above. Syrena Burges MD Pager [phone]  

## 2015-05-25 ENCOUNTER — Other Ambulatory Visit: Payer: Self-pay | Admitting: Pharmacist

## 2015-07-19 ENCOUNTER — Other Ambulatory Visit: Payer: Self-pay

## 2015-07-19 DIAGNOSIS — Z1231 Encounter for screening mammogram for malignant neoplasm of breast: Secondary | ICD-10-CM

## 2015-07-27 ENCOUNTER — Encounter: Payer: Self-pay | Admitting: Pharmacist

## 2015-07-27 ENCOUNTER — Ambulatory Visit (INDEPENDENT_AMBULATORY_CARE_PROVIDER_SITE_OTHER): Payer: Self-pay | Admitting: Family Medicine

## 2015-07-27 VITALS — BP 114/74 | Wt 194.0 lb

## 2015-07-27 DIAGNOSIS — E119 Type 2 diabetes mellitus without complications: Secondary | ICD-10-CM

## 2015-07-27 NOTE — Progress Notes (Signed)
Subjective:  Patient is a 64 yo female with type 2 diabetes who presents today for 3 month follow-up as part of the employer-sponsored Link to Wellness program. Current diabetes regimen includes Kazano (alogliptin/metformin), and Invokana. Patient does not take a daily ASA, ACEi, or statin. Most recent MD follow-up was Sept 2016. Patient has a pending appt for 6 mo follow-up in March 2017.  No med changes or major health changes at this time.   Diabetes Assessment:  No changes to diabetes regimen at this time. Patient does maintain good medication compliance. Most recent A1c was slightly increased at 6.3% (prev 6.1%) but remains at goal of less than 7%.  A1c will be checked at upcoming appt in March.  Weight has decreased by 6 lb since last visit.  Patient did bring meter today and is currently testing only when symptomatic, and on occasion 2-3 times per month.   Hypoglycemia is rare.  Patient reports 1-2 episodes of mild hypoglycemia that was easily corrected with carb.  Patient does demonstrate appropriate correction of hypoglycemia when needed.  Previously patient reported some mild numbness and tingling in feet at night; however, she reports this has improved recently.  Patient is up to date on eye exam and dental exam.        Lifestyle Assessment:  Exericse - Continues to walk dog three days per week for 45 minutes each time.  Feels confident she will be able to maintain this throughout the fall/winter months.     Diet - Patient started participating in Weight Watchers in late-October.  She has been attending meetings and tracking points.  As a result she has lost 6 lbs in the past 2-4 weeks.  Most notably, patient feels that adhering to the point system has reduced overall dairy consumption, has reduced overall carbs, and has encouraged her to better manage portion sizes.  She is successful in adhering to her daily points.   Diet consists mostly of protein, vegetables, and fruit with little carb  and dairy.     Plan and Goals: 1)  Continue making healthy dietary choices, great job with improvements! 2)  Exercise - Continue to exercise at least three days per week 4)  Continue testing as needed 3)  Follow-up in 3 months on Thursday, Feb 16th @ 11:00 am  Great to see you today!  Tilman Neat, PharmD Link to [facility]  [phone]

## 2015-07-27 NOTE — Patient Instructions (Signed)
1)  Continue making healthy dietary choices, great job with improvements! 2)  Exercise - Continue to exercise at least three days per week 4)  Continue testing as needed 3)  Follow-up in 3 months on Thursday, Feb 16th @ 11:00 am  Great to see you today!

## 2015-08-07 NOTE — Progress Notes (Signed)
I have reviewed this pharmacist's note and agree  

## 2015-08-25 ENCOUNTER — Ambulatory Visit
Admission: RE | Admit: 2015-08-25 | Discharge: 2015-08-25 | Disposition: A | Discharge status: Home / Self Care | Payer: 59 | Source: Ambulatory Visit

## 2015-08-25 DIAGNOSIS — Z1231 Encounter for screening mammogram for malignant neoplasm of breast: Secondary | ICD-10-CM

## 2015-09-11 MED FILL — INVOKANA 100 MG TABLET: 100 | 30 days supply | Qty: 30 | Fill #0

## 2015-09-28 MED FILL — KAZANO 12.5-1,000 MG TABLET: 12.5-1000 | 60 days supply | Qty: 120 | Fill #1

## 2015-10-11 MED FILL — INVOKANA 100 MG TABLET: 100 | 90 days supply | Qty: 90 | Fill #1

## 2015-10-26 ENCOUNTER — Encounter: Payer: Self-pay | Admitting: Pharmacist

## 2015-10-26 ENCOUNTER — Ambulatory Visit (INDEPENDENT_AMBULATORY_CARE_PROVIDER_SITE_OTHER): Payer: Self-pay | Admitting: Family Medicine

## 2015-10-26 VITALS — BP 118/68 | Wt 183.0 lb

## 2015-10-26 DIAGNOSIS — E119 Type 2 diabetes mellitus without complications: Secondary | ICD-10-CM

## 2015-10-26 MED FILL — ARMOUR THYROID 30 MG TABLET: 30 | 90 days supply | Qty: 90 | Fill #1

## 2015-10-26 NOTE — Patient Instructions (Signed)
1)  Continue making healthy dietary choices and adhere to weight watchers, great job with weight loss! 2)  Exercise - Continue to exercise at least twice per week, attempt to increase to three times weekly 4)  Continue testing as needed 3)  Follow-up in 4 months on Wednesday 02/21/16 @ 11:00 am  Great to see you today!

## 2015-10-26 NOTE — Progress Notes (Signed)
Subjective:  Patient is a 65 yo female with type 2 diabetes who presents today for 3 month follow-up as part of the employer-sponsored Link to Wellness program. Current diabetes regimen includes Kazano (alogliptin/metformin), and Invokana. Patient does not take a daily ASA, ACEi, or statin. Most recent MD follow-up was Sept 2016. Patient has a pending appt for 6 mo follow-up in March 2017.  No med changes or major health changes at this time.   Diabetes Assessment:  No changes to diabetes regimen at this time. Patient does maintain good medication compliance. Most recent A1c was slightly increased at 6.3% (prev 6.1%) in Sept 2016 but remains at goal of less than 7%.  A1c will be checked at upcoming appt in March.  Weight has decreased by an additional 11 lb since last visit, making weight loss of 17 lb over previous 6 months.  Patient did bring meter today and is currently testing 2-3 times per month or when symptomatic.   Hypoglycemia is occasional, usually occurring late afternoon and is corrected easily with a snack and pt usually follows with an early supper.  She does not usually test when symptomatic so unsure how low glucose drops.  Previously patient reported some mild numbness and tingling in feet at night; however, she reports this has improved recently with improving glucose readings.  Patient is up to date on eye exam and dental exam.      Glucose Averages: 7d - 96 14d - 92 30d - 93 Overall readings range 80-110   Lifestyle Assessment:  Exericse - Continues to walk dog, but has decreased to twice per week for 30-60 minutes each time.  She walks at a local park and usually does two laps.  In the past she was walking three times weekly and will attempt to increase to this once again.     Diet - Patient continues participating in Weight Watchers started late-October 2016.  She has been attending meetings and tracking points.  As a result she has lost 17 lbs in the past 6 months.  Most  notably, patient feels that adhering to the point system has reduced overall dairy consumption, has reduced overall carbs, and has encouraged her to better manage portion sizes.  She is successful in adhering to her daily points.   Diet consists mostly of protein, vegetables, and fruit with little carb and dairy.   Plan and Goals: 1)  Continue making healthy dietary choices and adhere to weight watchers, great job with weight loss! 2)  Exercise - Continue to exercise at least twice per week, attempt to increase to three times weekly 4)  Continue testing as needed 3)  Follow-up in 4 months on Wednesday 02/21/16 @ 11:00 am  Great to see you today!  Tilman Neat, PharmD Link to [facility]  [phone]

## 2015-10-31 DIAGNOSIS — E119 Type 2 diabetes mellitus without complications: Secondary | ICD-10-CM | POA: Diagnosis not present

## 2015-10-31 DIAGNOSIS — E039 Hypothyroidism, unspecified: Secondary | ICD-10-CM | POA: Diagnosis not present

## 2015-10-31 DIAGNOSIS — I10 Essential (primary) hypertension: Secondary | ICD-10-CM | POA: Diagnosis not present

## 2015-11-02 MED FILL — BYSTOLIC 5 MG TABLET: 5 | 90 days supply | Qty: 90 | Fill #0

## 2015-11-02 NOTE — Progress Notes (Signed)
I have reviewed this pharmacist's note and agree  

## 2015-11-07 DIAGNOSIS — E032 Hypothyroidism due to medicaments and other exogenous substances: Secondary | ICD-10-CM | POA: Diagnosis not present

## 2015-11-07 DIAGNOSIS — I10 Essential (primary) hypertension: Secondary | ICD-10-CM | POA: Diagnosis not present

## 2015-11-07 DIAGNOSIS — E118 Type 2 diabetes mellitus with unspecified complications: Secondary | ICD-10-CM | POA: Diagnosis not present

## 2015-11-07 DIAGNOSIS — E789 Disorder of lipoprotein metabolism, unspecified: Secondary | ICD-10-CM | POA: Diagnosis not present

## 2015-11-21 DIAGNOSIS — E119 Type 2 diabetes mellitus without complications: Secondary | ICD-10-CM | POA: Diagnosis not present

## 2015-11-21 DIAGNOSIS — I10 Essential (primary) hypertension: Secondary | ICD-10-CM | POA: Diagnosis not present

## 2015-11-21 DIAGNOSIS — E039 Hypothyroidism, unspecified: Secondary | ICD-10-CM | POA: Diagnosis not present

## 2015-11-28 DIAGNOSIS — E7800 Pure hypercholesterolemia, unspecified: Secondary | ICD-10-CM | POA: Diagnosis not present

## 2015-11-28 DIAGNOSIS — I10 Essential (primary) hypertension: Secondary | ICD-10-CM | POA: Diagnosis not present

## 2015-11-28 DIAGNOSIS — E119 Type 2 diabetes mellitus without complications: Secondary | ICD-10-CM | POA: Diagnosis not present

## 2015-11-28 DIAGNOSIS — E039 Hypothyroidism, unspecified: Secondary | ICD-10-CM | POA: Diagnosis not present

## 2015-11-28 MED FILL — KAZANO 12.5-1,000 MG TABLET: 12.5-1000 | 30 days supply | Qty: 60 | Fill #2

## 2015-12-21 MED FILL — KAZANO 12.5-1,000 MG TABLET: 12.5-1000 | 90 days supply | Qty: 180 | Fill #0

## 2016-01-08 MED FILL — INVOKANA 100 MG TABLET: 100 | 90 days supply | Qty: 90 | Fill #2

## 2016-01-30 MED FILL — ARMOUR THYROID 30 MG TABLET: 30 | 90 days supply | Qty: 90 | Fill #2

## 2016-01-30 MED FILL — BYSTOLIC 5 MG TABLET: 5 | 90 days supply | Qty: 90 | Fill #1

## 2016-01-31 DIAGNOSIS — I10 Essential (primary) hypertension: Secondary | ICD-10-CM | POA: Diagnosis not present

## 2016-01-31 DIAGNOSIS — E119 Type 2 diabetes mellitus without complications: Secondary | ICD-10-CM | POA: Diagnosis not present

## 2016-02-06 DIAGNOSIS — E118 Type 2 diabetes mellitus with unspecified complications: Secondary | ICD-10-CM | POA: Diagnosis not present

## 2016-02-21 ENCOUNTER — Ambulatory Visit: Payer: 59 | Admitting: Pharmacist

## 2016-04-11 MED FILL — INVOKANA 100 MG TABLET: 100 | 90 days supply | Qty: 90 | Fill #3

## 2016-04-24 MED FILL — ARMOUR THYROID 30 MG TABLET: 30 | 90 days supply | Qty: 90 | Fill #3

## 2016-04-25 ENCOUNTER — Encounter: Payer: Self-pay | Admitting: Pharmacist

## 2016-04-25 ENCOUNTER — Other Ambulatory Visit: Payer: Self-pay | Admitting: Pharmacist

## 2016-04-25 VITALS — BP 116/62 | Wt 175.0 lb

## 2016-04-25 DIAGNOSIS — E119 Type 2 diabetes mellitus without complications: Secondary | ICD-10-CM

## 2016-04-25 MED FILL — BYSTOLIC 5 MG TABLET: 5 | 90 days supply | Qty: 90 | Fill #2

## 2016-04-25 NOTE — Patient Outreach (Signed)
[facility]) Care Management  04/25/2016  Susan Montes February 18, 1951 [id number]   Subjective:  Patient is a 65 yo female with type 2 diabetes who presents today for 3 month follow-up as part of the employer-sponsored Link to Wellness program. Current diabetes regimen includes Kazano (alogliptin/metformin), and Invokana. Patient does not take a daily ASA, ACEi, or statin. Most recent MD follow-up was March 2017.  Patient has a pending appt for 6 mo follow-up in August 2017.  No major health changes at this time.  One med change, Kazano dose decreased from twice daily to once daily.    Diabetes Assessment:  Changes to diabetes regimen include decreasing Kazano from twice daily to once daily.  Patient does maintain good medication compliance. Most recent A1c was improved at 5.3% (prev 6.3%) in March 2017.  A1c will be checked at upcoming appt in late Aug.  Weight has decreased by an additional 8 lb since last visit.  Patient did not bring meter today but is currently testing 2-3 times per month or when symptomatic.   Hypoglycemia is rare.   Previously patient reported some mild numbness and tingling in feet at night; however, she reports this has improved recently with improving glucose readings.  Patient is up to date on eye exam and dental exam.       Readings - 106 fasting   Lifestyle Assessment:  Exericse - Continues to walk dog several times per week for at least 30 minutes each time.  In addition she is now participating in group exercises classes including yoga, cardiosculpt, and core/strength classes through [facility].  She participates in classes 1-2 days per week.   Diet - Patient continues participating in Weight Watchers started late-October 2016.  She has been attending meetings and tracking points diligently. As a result she continues to lose weight.    Plan and Goals: 1)  Continue making healthy dietary choices and adhere to weight watchers, great job with  weight loss! 2)  Exercise - Continue to exercise at least twice per week, attempt to increase to three times weekly 4)  Continue testing as needed 3)  Follow-up in 6 months on Thursday February 8th @ 11:00 am  Great to see you today!  Tilman Neat, PharmD Link to [facility]  [phone]

## 2016-05-01 DIAGNOSIS — E039 Hypothyroidism, unspecified: Secondary | ICD-10-CM | POA: Diagnosis not present

## 2016-05-01 DIAGNOSIS — E119 Type 2 diabetes mellitus without complications: Secondary | ICD-10-CM | POA: Diagnosis not present

## 2016-05-01 DIAGNOSIS — I10 Essential (primary) hypertension: Secondary | ICD-10-CM | POA: Diagnosis not present

## 2016-05-08 DIAGNOSIS — E118 Type 2 diabetes mellitus with unspecified complications: Secondary | ICD-10-CM | POA: Diagnosis not present

## 2016-05-08 DIAGNOSIS — I10 Essential (primary) hypertension: Secondary | ICD-10-CM | POA: Diagnosis not present

## 2016-05-08 DIAGNOSIS — E032 Hypothyroidism due to medicaments and other exogenous substances: Secondary | ICD-10-CM | POA: Diagnosis not present

## 2016-05-27 DIAGNOSIS — I10 Essential (primary) hypertension: Secondary | ICD-10-CM | POA: Diagnosis not present

## 2016-05-27 DIAGNOSIS — E039 Hypothyroidism, unspecified: Secondary | ICD-10-CM | POA: Diagnosis not present

## 2016-05-27 DIAGNOSIS — E119 Type 2 diabetes mellitus without complications: Secondary | ICD-10-CM | POA: Diagnosis not present

## 2016-05-30 DIAGNOSIS — I10 Essential (primary) hypertension: Secondary | ICD-10-CM | POA: Diagnosis not present

## 2016-05-30 DIAGNOSIS — E7800 Pure hypercholesterolemia, unspecified: Secondary | ICD-10-CM | POA: Diagnosis not present

## 2016-05-30 DIAGNOSIS — Z0000 Encounter for general adult medical examination without abnormal findings: Secondary | ICD-10-CM | POA: Diagnosis not present

## 2016-05-30 DIAGNOSIS — E119 Type 2 diabetes mellitus without complications: Secondary | ICD-10-CM | POA: Diagnosis not present

## 2016-06-18 DIAGNOSIS — H938X2 Other specified disorders of left ear: Secondary | ICD-10-CM | POA: Diagnosis not present

## 2016-07-01 DIAGNOSIS — D2271 Melanocytic nevi of right lower limb, including hip: Secondary | ICD-10-CM | POA: Diagnosis not present

## 2016-07-01 DIAGNOSIS — D225 Melanocytic nevi of trunk: Secondary | ICD-10-CM | POA: Diagnosis not present

## 2016-07-01 DIAGNOSIS — D1801 Hemangioma of skin and subcutaneous tissue: Secondary | ICD-10-CM | POA: Diagnosis not present

## 2016-07-01 DIAGNOSIS — L570 Actinic keratosis: Secondary | ICD-10-CM | POA: Diagnosis not present

## 2016-07-01 DIAGNOSIS — L821 Other seborrheic keratosis: Secondary | ICD-10-CM | POA: Diagnosis not present

## 2016-07-01 DIAGNOSIS — L218 Other seborrheic dermatitis: Secondary | ICD-10-CM | POA: Diagnosis not present

## 2016-07-01 DIAGNOSIS — D2272 Melanocytic nevi of left lower limb, including hip: Secondary | ICD-10-CM | POA: Diagnosis not present

## 2016-07-11 MED FILL — KAZANO 12.5-1,000 MG TABLET: 12.5-1000 | 90 days supply | Qty: 90 | Fill #0

## 2016-07-12 MED FILL — INVOKANA 100 MG TABLET: 100 | 60 days supply | Qty: 60 | Fill #0

## 2016-07-22 MED FILL — ARMOUR THYROID 30 MG TABLET: 30 | 90 days supply | Qty: 90 | Fill #0

## 2016-07-22 MED FILL — BYSTOLIC 5 MG TABLET: 5 | 90 days supply | Qty: 90 | Fill #3

## 2016-07-23 MED FILL — TRUE METRIX GLUCOSE TEST ST: 90 days supply | Qty: 100 | Fill #1

## 2016-07-24 ENCOUNTER — Other Ambulatory Visit: Payer: Self-pay | Admitting: Internal Medicine

## 2016-07-24 DIAGNOSIS — Z1231 Encounter for screening mammogram for malignant neoplasm of breast: Secondary | ICD-10-CM

## 2016-08-08 DIAGNOSIS — E119 Type 2 diabetes mellitus without complications: Secondary | ICD-10-CM | POA: Diagnosis not present

## 2016-08-08 DIAGNOSIS — I10 Essential (primary) hypertension: Secondary | ICD-10-CM | POA: Diagnosis not present

## 2016-08-08 DIAGNOSIS — E039 Hypothyroidism, unspecified: Secondary | ICD-10-CM | POA: Diagnosis not present

## 2016-08-15 DIAGNOSIS — Z23 Encounter for immunization: Secondary | ICD-10-CM | POA: Diagnosis not present

## 2016-08-15 DIAGNOSIS — E118 Type 2 diabetes mellitus with unspecified complications: Secondary | ICD-10-CM | POA: Diagnosis not present

## 2016-08-15 DIAGNOSIS — E032 Hypothyroidism due to medicaments and other exogenous substances: Secondary | ICD-10-CM | POA: Diagnosis not present

## 2016-08-30 ENCOUNTER — Ambulatory Visit
Admission: RE | Admit: 2016-08-30 | Discharge: 2016-08-30 | Disposition: A | Discharge status: Home / Self Care | Payer: 59 | Source: Ambulatory Visit | Attending: Internal Medicine | Admitting: Internal Medicine

## 2016-08-30 DIAGNOSIS — Z1231 Encounter for screening mammogram for malignant neoplasm of breast: Secondary | ICD-10-CM

## 2016-09-09 DIAGNOSIS — C439 Malignant melanoma of skin, unspecified: Secondary | ICD-10-CM

## 2016-09-09 HISTORY — DX: Malignant melanoma of skin, unspecified: C43.9

## 2016-09-10 MED FILL — INVOKANA 100 MG TABLET: 100 | 30 days supply | Qty: 30 | Fill #0

## 2016-10-14 MED FILL — INVOKANA 100 MG TABLET: 100 | 30 days supply | Qty: 30 | Fill #0

## 2016-10-17 ENCOUNTER — Ambulatory Visit: Payer: 59 | Admitting: Pharmacist

## 2016-10-21 MED FILL — AZITHROMYCIN 250 MG TABLET: 250 | 5 days supply | Qty: 6 | Fill #0

## 2016-10-21 MED FILL — OSELTAMIVIR PHOSPHATE 75 MG: 75 | 5 days supply | Qty: 10 | Fill #0

## 2016-10-30 MED FILL — ARMOUR THYROID 30 MG TABLET: 30 | 90 days supply | Qty: 90 | Fill #1

## 2016-11-04 MED FILL — BYSTOLIC 5 MG TABLET: 5 | 90 days supply | Qty: 90 | Fill #0

## 2016-11-11 MED FILL — INVOKANA 100 MG TABLET: 100 | 30 days supply | Qty: 30 | Fill #0

## 2016-12-10 DIAGNOSIS — I10 Essential (primary) hypertension: Secondary | ICD-10-CM | POA: Diagnosis not present

## 2016-12-10 DIAGNOSIS — E119 Type 2 diabetes mellitus without complications: Secondary | ICD-10-CM | POA: Diagnosis not present

## 2016-12-10 DIAGNOSIS — E7800 Pure hypercholesterolemia, unspecified: Secondary | ICD-10-CM | POA: Diagnosis not present

## 2016-12-11 MED FILL — INVOKANA 100 MG TABLET: 100 | 30 days supply | Qty: 30 | Fill #1

## 2016-12-17 DIAGNOSIS — I10 Essential (primary) hypertension: Secondary | ICD-10-CM | POA: Diagnosis not present

## 2016-12-17 DIAGNOSIS — E119 Type 2 diabetes mellitus without complications: Secondary | ICD-10-CM | POA: Diagnosis not present

## 2016-12-17 DIAGNOSIS — E7800 Pure hypercholesterolemia, unspecified: Secondary | ICD-10-CM | POA: Diagnosis not present

## 2016-12-17 MED FILL — JENTADUETO XR 2.5 MG-1,000: 2.5-1000 | 90 days supply | Qty: 180 | Fill #0

## 2017-01-07 MED FILL — INVOKANA 100 MG TABLET: 100 | 30 days supply | Qty: 30 | Fill #2

## 2017-01-29 MED FILL — BYSTOLIC 5 MG TABLET: 5 | 90 days supply | Qty: 90 | Fill #1

## 2017-01-29 MED FILL — ARMOUR THYROID 30 MG TABLET: 30 | 90 days supply | Qty: 90 | Fill #2

## 2017-02-06 MED FILL — INVOKANA 100 MG TABLET: 100 | 90 days supply | Qty: 90 | Fill #0

## 2017-02-17 DIAGNOSIS — E119 Type 2 diabetes mellitus without complications: Secondary | ICD-10-CM | POA: Diagnosis not present

## 2017-02-17 DIAGNOSIS — I10 Essential (primary) hypertension: Secondary | ICD-10-CM | POA: Diagnosis not present

## 2017-02-26 DIAGNOSIS — H52223 Regular astigmatism, bilateral: Secondary | ICD-10-CM | POA: Diagnosis not present

## 2017-02-26 DIAGNOSIS — H5203 Hypermetropia, bilateral: Secondary | ICD-10-CM | POA: Diagnosis not present

## 2017-02-26 DIAGNOSIS — H524 Presbyopia: Secondary | ICD-10-CM | POA: Diagnosis not present

## 2017-02-26 DIAGNOSIS — H18413 Arcus senilis, bilateral: Secondary | ICD-10-CM | POA: Diagnosis not present

## 2017-03-05 MED FILL — CLOBETASOL 0.05% SOLUTION: 0.05 | 15 days supply | Qty: 50 | Fill #0

## 2017-04-14 DIAGNOSIS — E7800 Pure hypercholesterolemia, unspecified: Secondary | ICD-10-CM | POA: Diagnosis not present

## 2017-04-14 DIAGNOSIS — E119 Type 2 diabetes mellitus without complications: Secondary | ICD-10-CM | POA: Diagnosis not present

## 2017-04-18 MED FILL — LEVOTHYROXINE 75 MCG TABLET: 75 | 90 days supply | Qty: 90 | Fill #0

## 2017-04-18 MED FILL — JANUMET XR 50-1,000 MG TAB: 50-1000 | 30 days supply | Qty: 60 | Fill #0

## 2017-04-25 MED FILL — INVOKANA 100 MG TABLET: 100 | 60 days supply | Qty: 60 | Fill #0

## 2017-04-29 MED FILL — BYSTOLIC 5 MG TABLET: 5 | 90 days supply | Qty: 90 | Fill #2

## 2017-05-21 DIAGNOSIS — K439 Ventral hernia without obstruction or gangrene: Secondary | ICD-10-CM | POA: Diagnosis not present

## 2017-05-21 DIAGNOSIS — E1169 Type 2 diabetes mellitus with other specified complication: Secondary | ICD-10-CM | POA: Diagnosis not present

## 2017-05-21 DIAGNOSIS — I10 Essential (primary) hypertension: Secondary | ICD-10-CM | POA: Diagnosis not present

## 2017-05-21 DIAGNOSIS — E669 Obesity, unspecified: Secondary | ICD-10-CM | POA: Diagnosis not present

## 2017-05-23 ENCOUNTER — Other Ambulatory Visit: Payer: Self-pay | Admitting: Surgery

## 2017-05-23 DIAGNOSIS — K439 Ventral hernia without obstruction or gangrene: Secondary | ICD-10-CM

## 2017-05-26 DIAGNOSIS — Z0000 Encounter for general adult medical examination without abnormal findings: Secondary | ICD-10-CM | POA: Diagnosis not present

## 2017-05-29 ENCOUNTER — Ambulatory Visit
Admission: RE | Admit: 2017-05-29 | Discharge: 2017-05-29 | Disposition: A | Discharge status: Home / Self Care | Payer: 59 | Source: Ambulatory Visit | Attending: Surgery | Admitting: Surgery

## 2017-05-29 DIAGNOSIS — K439 Ventral hernia without obstruction or gangrene: Secondary | ICD-10-CM | POA: Diagnosis not present

## 2017-05-29 MED ORDER — IOPAMIDOL (ISOVUE-300) INJECTION 61%
100.0000 mL | Freq: Once | INTRAVENOUS | Status: AC | PRN
  Administered 2017-05-29: 100 mL via INTRAVENOUS

## 2017-06-02 DIAGNOSIS — E039 Hypothyroidism, unspecified: Secondary | ICD-10-CM | POA: Diagnosis not present

## 2017-06-02 DIAGNOSIS — Z0000 Encounter for general adult medical examination without abnormal findings: Secondary | ICD-10-CM | POA: Diagnosis not present

## 2017-06-02 DIAGNOSIS — E119 Type 2 diabetes mellitus without complications: Secondary | ICD-10-CM | POA: Diagnosis not present

## 2017-06-02 MED FILL — JANUMET XR 50-1,000 MG TAB: 50-1000 | 30 days supply | Qty: 60 | Fill #1

## 2017-06-17 MED FILL — JARDIANCE 10 MG TABLET: 10 | 90 days supply | Qty: 90 | Fill #0

## 2017-06-27 MED FILL — JANUMET XR 50-1,000 MG TAB: 50-1000 | 30 days supply | Qty: 60 | Fill #2

## 2017-07-01 DIAGNOSIS — I10 Essential (primary) hypertension: Secondary | ICD-10-CM | POA: Diagnosis not present

## 2017-07-01 DIAGNOSIS — E1169 Type 2 diabetes mellitus with other specified complication: Secondary | ICD-10-CM | POA: Diagnosis not present

## 2017-07-01 DIAGNOSIS — L814 Other melanin hyperpigmentation: Secondary | ICD-10-CM | POA: Diagnosis not present

## 2017-07-01 DIAGNOSIS — L218 Other seborrheic dermatitis: Secondary | ICD-10-CM | POA: Diagnosis not present

## 2017-07-01 DIAGNOSIS — D692 Other nonthrombocytopenic purpura: Secondary | ICD-10-CM | POA: Diagnosis not present

## 2017-07-01 DIAGNOSIS — L821 Other seborrheic keratosis: Secondary | ICD-10-CM | POA: Diagnosis not present

## 2017-07-01 DIAGNOSIS — D2262 Melanocytic nevi of left upper limb, including shoulder: Secondary | ICD-10-CM | POA: Diagnosis not present

## 2017-07-01 DIAGNOSIS — E669 Obesity, unspecified: Secondary | ICD-10-CM | POA: Diagnosis not present

## 2017-07-01 DIAGNOSIS — D1801 Hemangioma of skin and subcutaneous tissue: Secondary | ICD-10-CM | POA: Diagnosis not present

## 2017-07-01 DIAGNOSIS — D225 Melanocytic nevi of trunk: Secondary | ICD-10-CM | POA: Diagnosis not present

## 2017-07-01 DIAGNOSIS — D0361 Melanoma in situ of right upper limb, including shoulder: Secondary | ICD-10-CM | POA: Diagnosis not present

## 2017-07-01 DIAGNOSIS — K439 Ventral hernia without obstruction or gangrene: Secondary | ICD-10-CM | POA: Diagnosis not present

## 2017-07-10 ENCOUNTER — Ambulatory Visit: Payer: Self-pay | Admitting: Surgery

## 2017-07-28 MED FILL — JANUMET XR 50-1,000 MG TAB: 50-1000 | 30 days supply | Qty: 60 | Fill #3

## 2017-07-28 MED FILL — BYSTOLIC 5 MG TABLET: 5 | 90 days supply | Qty: 90 | Fill #0

## 2017-07-28 MED FILL — LEVOTHYROXINE 75 MCG TABLET: 75 | 90 days supply | Qty: 90 | Fill #1

## 2017-07-29 DIAGNOSIS — D0361 Melanoma in situ of right upper limb, including shoulder: Secondary | ICD-10-CM | POA: Diagnosis not present

## 2017-07-29 MED FILL — MUPIROCIN 2% OINTMENT: 2 | 10 days supply | Qty: 22 | Fill #0

## 2017-07-29 NOTE — Patient Instructions (Signed)
Susan Montes  07/29/2017   Your procedure is scheduled on:   Report to [place]  elevators to 3rd floor to  [place] at AM.  Report to admitting at AM Follow signs to Short Stay on first floor at AM  Call this number if you have problems the morning of surgery [phone]   Remember: ONLY 1 PERSON MAY GO WITH YOU TO SHORT STAY TO GET  [name].  Do not eat food or drink liquids :After Midnight.     Take these medicines the morning of surgery with A SIP OF WATER:  DO NOT TAKE ANY DIABETIC MEDICATIONS DAY OF YOUR SURGERY                               You may not have any metal on your body including hair pins and              piercings  Do not wear jewelry, make-up, lotions, powders or perfumes, deodorant             Do not wear nail polish.  Do not shave  48 hours prior to surgery.              Men may shave face and neck.   Do not bring valuables to the hospital. [name].  Contacts, dentures or bridgework may not be worn into surgery.  Leave suitcase in the car. After surgery it may be brought to your room.     Patients discharged the day of surgery will not be allowed to drive home.  Name and phone number of your driver:  Special Instructions: N/A              Please read over the following fact sheets you were given: _____________________________________________________________________             [facility] - Preparing for Surgery Before surgery, you can play an important role.  Because skin is not sterile, your skin needs to be as free of germs as possible.  You can reduce the number of germs on your skin by washing with CHG (chlorahexidine gluconate) soap before surgery.  CHG is an antiseptic cleaner which kills germs and bonds with the skin to continue killing germs even after washing. Please DO NOT use if you have an  allergy to CHG or antibacterial soaps.  If your skin becomes reddened/irritated stop using the CHG and inform your nurse when you arrive at Short Stay. Do not shave (including legs and underarms) for at least 48 hours prior to the first CHG shower.  You may shave your face/neck.  Please follow these instructions carefully:  1.  Shower with CHG Soap the night before surgery and the  morning of surgery.  2.  If you choose to wash your hair, wash your hair first as usual with your normal  shampoo.  3.  After you shampoo, rinse your hair and body thoroughly to remove the shampoo.                             4.  Use CHG as you would  any other liquid soap.  You can apply chg directly to the skin and wash.  Gently with a scrungie or clean washcloth.  5.  Apply the CHG Soap to your body ONLY FROM THE NECK DOWN.   Do   not use on face/ open                           Wound or open sores. Avoid contact with eyes, ears mouth and   genitals (private parts).                       Wash face,  Genitals (private parts) with your normal soap.             6.  Wash thoroughly, paying special attention to the area where your    surgery  will be performed.  7.  Thoroughly rinse your body with warm water from the neck down.  8.  DO NOT shower/wash with your normal soap after using and rinsing off the CHG Soap.                9.  Pat yourself dry with a clean towel.            10.  Wear clean pajamas.            11.  Place clean sheets on your bed the night of your first shower and do not  sleep with pets. Day of Surgery : Do not apply any lotions/deodorants the morning of surgery.  Please wear clean clothes to the hospital/surgery center.  FAILURE TO FOLLOW THESE INSTRUCTIONS MAY RESULT IN THE CANCELLATION OF YOUR SURGERY  PATIENT SIGNATURE_________________________________  NURSE SIGNATURE__________________________________  ________________________________________________________________________

## 2017-08-04 ENCOUNTER — Encounter (HOSPITAL_COMMUNITY)
Admission: RE | Admit: 2017-08-04 | Discharge: 2017-08-04 | Disposition: A | Discharge status: Home / Self Care | Payer: 59 | Source: Ambulatory Visit | Attending: Surgery | Admitting: Surgery

## 2017-08-07 ENCOUNTER — Encounter (HOSPITAL_COMMUNITY): Admission: RE | Payer: Self-pay | Source: Ambulatory Visit

## 2017-08-07 ENCOUNTER — Inpatient Hospital Stay (HOSPITAL_COMMUNITY): Admission: RE | Admit: 2017-08-07 | Payer: 59 | Source: Ambulatory Visit | Admitting: Surgery

## 2017-08-07 SURGERY — RECURRENT HERNIA
Anesthesia: General

## 2017-08-19 ENCOUNTER — Other Ambulatory Visit: Payer: Self-pay | Admitting: Internal Medicine

## 2017-08-19 DIAGNOSIS — Z1231 Encounter for screening mammogram for malignant neoplasm of breast: Secondary | ICD-10-CM

## 2017-08-25 MED FILL — JANUMET XR 50-1,000 MG TAB: 50-1000 | 30 days supply | Qty: 60 | Fill #4

## 2017-09-08 MED FILL — JARDIANCE 10 MG TABLET: 10 | 90 days supply | Qty: 90 | Fill #1

## 2017-09-17 ENCOUNTER — Ambulatory Visit: Payer: 59

## 2017-09-19 ENCOUNTER — Ambulatory Visit
Admission: RE | Admit: 2017-09-19 | Discharge: 2017-09-19 | Disposition: A | Discharge status: Home / Self Care | Payer: 59 | Source: Ambulatory Visit | Attending: Internal Medicine | Admitting: Internal Medicine

## 2017-09-19 DIAGNOSIS — Z1231 Encounter for screening mammogram for malignant neoplasm of breast: Secondary | ICD-10-CM

## 2017-09-19 DIAGNOSIS — Z853 Personal history of malignant neoplasm of breast: Secondary | ICD-10-CM | POA: Diagnosis not present

## 2017-09-19 HISTORY — DX: Personal history of irradiation: Z92.3

## 2017-09-19 HISTORY — DX: Malignant neoplasm of unspecified site of unspecified female breast: C50.919

## 2017-09-24 MED FILL — JANUMET XR 50-1,000 MG TAB: 50-1000 | 30 days supply | Qty: 60 | Fill #5

## 2017-10-22 MED FILL — LEVOTHYROXINE 75 MCG TABLET: 75 | 90 days supply | Qty: 90 | Fill #0

## 2017-10-29 MED FILL — BYSTOLIC 5 MG TABLET: 5 | 90 days supply | Qty: 90 | Fill #1

## 2017-10-30 MED FILL — JANUMET XR 50-1,000 MG TAB: 50-1000 | 30 days supply | Qty: 60 | Fill #0

## 2017-11-05 DIAGNOSIS — E119 Type 2 diabetes mellitus without complications: Secondary | ICD-10-CM | POA: Diagnosis not present

## 2017-11-05 DIAGNOSIS — E039 Hypothyroidism, unspecified: Secondary | ICD-10-CM | POA: Diagnosis not present

## 2017-11-10 DIAGNOSIS — I10 Essential (primary) hypertension: Secondary | ICD-10-CM | POA: Diagnosis not present

## 2017-11-10 DIAGNOSIS — E7800 Pure hypercholesterolemia, unspecified: Secondary | ICD-10-CM | POA: Diagnosis not present

## 2017-11-10 DIAGNOSIS — E039 Hypothyroidism, unspecified: Secondary | ICD-10-CM | POA: Diagnosis not present

## 2017-11-10 DIAGNOSIS — E118 Type 2 diabetes mellitus with unspecified complications: Secondary | ICD-10-CM | POA: Diagnosis not present

## 2017-11-25 MED FILL — JANUMET XR 50-1,000 MG TAB: 50-1000 | 30 days supply | Qty: 60 | Fill #1

## 2017-12-03 DIAGNOSIS — E118 Type 2 diabetes mellitus with unspecified complications: Secondary | ICD-10-CM | POA: Diagnosis not present

## 2017-12-03 DIAGNOSIS — E7800 Pure hypercholesterolemia, unspecified: Secondary | ICD-10-CM | POA: Diagnosis not present

## 2017-12-03 DIAGNOSIS — E039 Hypothyroidism, unspecified: Secondary | ICD-10-CM | POA: Diagnosis not present

## 2017-12-07 ENCOUNTER — Ambulatory Visit: Payer: Self-pay | Admitting: Surgery

## 2017-12-10 DIAGNOSIS — E7800 Pure hypercholesterolemia, unspecified: Secondary | ICD-10-CM | POA: Diagnosis not present

## 2017-12-10 DIAGNOSIS — Z780 Asymptomatic menopausal state: Secondary | ICD-10-CM | POA: Diagnosis not present

## 2017-12-10 DIAGNOSIS — E119 Type 2 diabetes mellitus without complications: Secondary | ICD-10-CM | POA: Diagnosis not present

## 2017-12-10 DIAGNOSIS — E039 Hypothyroidism, unspecified: Secondary | ICD-10-CM | POA: Diagnosis not present

## 2017-12-10 DIAGNOSIS — I10 Essential (primary) hypertension: Secondary | ICD-10-CM | POA: Diagnosis not present

## 2017-12-11 ENCOUNTER — Encounter (HOSPITAL_COMMUNITY): Payer: Self-pay

## 2017-12-11 NOTE — Patient Instructions (Addendum)
Your procedure is scheduled on: Monday, December 22, 2017   Surgery Time:  7:30AM-11:00AM   Report to [facility] Main  Entrance   Arrive by 5:30 AM pick up the phone at the front desk dial [phone], have a seat in the lobby and a staff member will come and escort you to Short Stay Department   Call this number if you have problems the morning of surgery [phone]   Do not eat food or drink liquids :After Midnight.   Do NOT smoke after Midnight   Complete one Ensure drink the morning of surgery by  4:30AM the day of surgery.   Take these medicines the morning of surgery with A SIP OF WATER: Levothyroxine, Bystolic   DO NOT TAKE ANY DIABETIC MEDICATIONS DAY OF YOUR SURGERY                               You may not have any metal on your body including hair pins, jewelry, and body piercings             Do not wear make-up, lotions, powders, perfumes/cologne, or deodorant             Do not wear nail polish.  Do not shave  48 hours prior to surgery.                Do not bring valuables to the hospital. [place].   Contacts, dentures or bridgework may not be worn into surgery.   Leave suitcase in the car. After surgery it may be brought to your room.   Special Instructions: Bring a copy of your healthcare power of attorney and living will documents         the day of surgery if you haven't scanned them in before.              Please read over the following fact sheets you were given:  [facility] - Preparing for Surgery Before surgery, you can play an important role.  Because skin is not sterile, your skin needs to be as free of germs as possible.  You can reduce the number of germs on your skin by washing with CHG (chlorahexidine gluconate) soap before surgery.  CHG is an antiseptic cleaner which kills germs and bonds with the skin to continue killing germs even after washing. Please DO NOT use if you have an allergy  to CHG or antibacterial soaps.  If your skin becomes reddened/irritated stop using the CHG and inform your nurse when you arrive at Short Stay. Do not shave (including legs and underarms) for at least 48 hours prior to the first CHG shower.  You may shave your face/neck.  Please follow these instructions carefully:  1.  Shower with CHG Soap the night before surgery and the  morning of surgery.  2.  If you choose to wash your hair, wash your hair first as usual with your normal  shampoo.  3.  After you shampoo, rinse your hair and body thoroughly to remove the shampoo.                             4.  Use CHG as you would any other liquid soap.  You can apply chg directly  to the skin and wash.  Gently with a scrungie or clean washcloth.  5.  Apply the CHG Soap to your body ONLY FROM THE NECK DOWN.   Do   not use on face/ open                           Wound or open sores. Avoid contact with eyes, ears mouth and   genitals (private parts).                       Wash face,  Genitals (private parts) with your normal soap.             6.  Wash thoroughly, paying special attention to the area where your    surgery  will be performed.  7.  Thoroughly rinse your body with warm water from the neck down.  8.  DO NOT shower/wash with your normal soap after using and rinsing off the CHG Soap.                9.  Pat yourself dry with a clean towel.            10.  Wear clean pajamas.            11.  Place clean sheets on your bed the night of your first shower and do not  sleep with pets. Day of Surgery : Do not apply any lotions/deodorants the morning of surgery.  Please wear clean clothes to the hospital/surgery center.  FAILURE TO FOLLOW THESE INSTRUCTIONS MAY RESULT IN THE CANCELLATION OF YOUR SURGERY  PATIENT SIGNATURE_________________________________  NURSE SIGNATURE__________________________________  ________________________________________________________________________

## 2017-12-15 ENCOUNTER — Encounter (HOSPITAL_COMMUNITY)
Admission: RE | Admit: 2017-12-15 | Discharge: 2017-12-15 | Disposition: A | Discharge status: Home / Self Care | Payer: 59 | Source: Ambulatory Visit | Attending: Surgery | Admitting: Surgery

## 2017-12-15 ENCOUNTER — Other Ambulatory Visit: Payer: Self-pay

## 2017-12-15 ENCOUNTER — Encounter (HOSPITAL_COMMUNITY): Payer: Self-pay

## 2017-12-15 DIAGNOSIS — Z01812 Encounter for preprocedural laboratory examination: Secondary | ICD-10-CM | POA: Diagnosis not present

## 2017-12-15 DIAGNOSIS — R9431 Abnormal electrocardiogram [ECG] [EKG]: Secondary | ICD-10-CM | POA: Insufficient documentation

## 2017-12-15 DIAGNOSIS — Z01818 Encounter for other preprocedural examination: Secondary | ICD-10-CM | POA: Diagnosis not present

## 2017-12-15 HISTORY — DX: Diverticulosis of large intestine without perforation or abscess without bleeding: K57.30

## 2017-12-15 HISTORY — DX: Personal history of other diseases of the female genital tract: Z87.42

## 2017-12-15 HISTORY — DX: Type 2 diabetes mellitus with diabetic nephropathy: E11.21

## 2017-12-15 HISTORY — DX: Hypothyroidism, unspecified: E03.9

## 2017-12-15 HISTORY — DX: Nausea with vomiting, unspecified: R11.2

## 2017-12-15 HISTORY — DX: Other specified postprocedural states: Z98.890

## 2017-12-15 HISTORY — DX: Unspecified osteoarthritis, unspecified site: M19.90

## 2017-12-15 HISTORY — DX: Malignant melanoma of skin, unspecified: C43.9

## 2017-12-15 LAB — CBC
HCT: 42.3 % (ref 36.0–46.0)
HEMOGLOBIN: 13.8 g/dL (ref 12.0–15.0)
MCH: 30.2 pg (ref 26.0–34.0)
MCHC: 32.6 g/dL (ref 30.0–36.0)
MCV: 92.6 fL (ref 78.0–100.0)
PLATELETS: 210 10*3/uL (ref 150–400)
RBC: 4.57 MIL/uL (ref 3.87–5.11)
RDW: 13 % (ref 11.5–15.5)
WBC: 6.2 10*3/uL (ref 4.0–10.5)

## 2017-12-15 LAB — GLUCOSE, CAPILLARY: Glucose-Capillary: 109 mg/dL — ABNORMAL HIGH (ref 65–99)

## 2017-12-15 MED FILL — JARDIANCE 10 MG TABLET: 10 | 90 days supply | Qty: 90 | Fill #0

## 2017-12-15 NOTE — Pre-Procedure Instructions (Signed)
CMP results 12/03/2017 results and Hgb A1C 11/05/2017 from doctor's office in patient's hard chart.

## 2017-12-19 ENCOUNTER — Other Ambulatory Visit: Payer: Self-pay | Admitting: *Deleted

## 2017-12-19 NOTE — Patient Outreach (Signed)
[facility] [facility]) Care Management  12/19/2017  Susan Montes 12-04-1950 [id number]   Subjective: Telephone call to patient's home  number, no answer, left HIPAA compliant voicemail message, and requested call back.    Objective:  Per KPN (Knowledge Performance Now, point of care tool) and chart review, patient to be admitted 12/22/17 for LAPAROSCOPIC/OPEN LOWER ABDOMINAL WALL HERNIA ERAS PATHWAY at [facility].    Patient also has a history of diabetes.    Assessment: Received [facility] Preoperative / Transition of care referral on 12/05/17.    Preoperative call follow up pending patient contact.     Plan: RNCM will call patient for 2nd telephone outreach attempt, preoperative call follow up, within 10 business days if no return call.     Narda Fundora H. Annia Friendly, BSN, [facility] Telephonic CM Phone: [phone] Fax: [phone]

## 2017-12-21 NOTE — Anesthesia Preprocedure Evaluation (Addendum)
Anesthesia Evaluation  Patient identified by MRN, date of birth, ID band Patient awake    Reviewed: Allergy & Precautions, H&P , Patient's Chart, lab work & pertinent test results, reviewed documented beta blocker date and time   History of Anesthesia Complications (+) PONV  Airway Mallampati: II  TM Distance: >3 FB     Dental   Pulmonary neg pulmonary ROS, former smoker,    breath sounds clear to auscultation       Cardiovascular Exercise Tolerance: Good hypertension, Pt. on medications and Pt. on home beta blockers negative cardio ROS   Rhythm:Regular Rate:Normal     Neuro/Psych negative neurological ROS  negative psych ROS   GI/Hepatic negative GI ROS, Neg liver ROS,   Endo/Other  negative endocrine ROSdiabetes, Type 2, Oral Hypoglycemic AgentsHypothyroidism   Renal/GU negative Renal ROS  negative genitourinary   Musculoskeletal  (+) Arthritis , Osteoarthritis,    Abdominal   Peds  Hematology negative hematology ROS (+)   Anesthesia Other Findings   Reproductive/Obstetrics negative OB ROS                            Anesthesia Physical Anesthesia Plan  ASA: III  Anesthesia Plan: General   Post-op Pain Management:    Induction: Intravenous  PONV Risk Score and Plan: 4 or greater and Ondansetron, Dexamethasone, Midazolam and Scopolamine patch - Pre-op  Airway Management Planned: Oral ETT  Additional Equipment:   Intra-op Plan:   Post-operative Plan: Extubation in OR  Informed Consent: I have reviewed the patients History and Physical, chart, labs and discussed the procedure including the risks, benefits and alternatives for the proposed anesthesia with the patient or authorized representative who has indicated his/her understanding and acceptance.   Dental advisory given  Plan Discussed with: CRNA  Anesthesia Plan Comments:        Anesthesia Quick Evaluation

## 2017-12-21 NOTE — H&P (Signed)
Susan Montes  Location: [facility] Patient #: [id number] DOB: Feb 07, 1951 Married / Language: English / Race: White Female  History of Present Illness   The patient is a 67 year old female who presents with a complaint of follow up.   The PCP is Dr. Georges Mouse.  She comes by herself.  We tried to schedule her surgery in December, 2018, but could not get a date. She is on for 12/22/2017 for laparoscopic/open ventral hernia repair. I again went over the hernia repair and its post op course. I talked about the risks of surgery, including infection, bowel injury, hematoma, nerve injury, and recurrence of the hernia.  I reviewed her CT scan with her. She's had a failure of her prior hernia repair in the right lower quadrant. I discussed about another attempt at fixing this hernia. This will probably a combined laparoscopic and open procedure. I think with the keys is getting the mesh long enough in the pelvis to hold. She underwent a CT scan of her abdomen on 05/29/2017 - 1) Prior ventral hernia repairs. Recurrent large right lower quadrant ventral hernia containing small bowel loops. No obstruction. 2) Left colonic diverticulosis. No active diverticulitis. I gave her copies of the report. We talked about the hospital course of 2-5 days. Talked about been out of work from 2-4 weeks. Talked about a possible drain. Talked about the risk of recurrence since this is her third repair. She still has some numbness in the left lower quadrant from her original gynecologic surgery.  History of hernia: I last saw Susan Montes in November 2012. At that time, we talked about repairing her recurrent lower abdominal wall hernia. I have tried to fix Susan Montes's hernia twice. She had a hysterectomy in Nov 2008 for a left ovarian mass. She had a rapid presentation of the hernia after that surgery. I tired to fix the ventral hernia first in  10/01/2007.  I did a laparoscopic approach using a 20 x 25 cm piece of Parietex mesh.   I described the original hernia a 15 x 16 cm.   The hernia failed within 6 months. I re-repaired the hernia in 10/01/2007. I used the same mesh to try to fix the hernia. Again it last 6 months before it failed. I did a CT scan 08/21/2009 to document the size of the hernia. Since I last saw her, she is lost about 15 pounds. She does not think the hernia is much larger as best she can tell. She's had no nausea or vomiting, no abdominal pain, but does feel this bulge in her right lower abdomen. She has no history of stomach, liver, or colon disease. Her last colonoscopy she estimates was 2010. I found where she had a colonoscopy by Dr. Teena Irani in July 2010.  Past Medical History: 1. Recurrent lower abdominal wall hernia.  2. Stage 0 carcinoma of the left breast. Disease free. Original surgery in 1999.  Last mammogram in Dec 2017. 3. Hypertension. 4. Non insulin dependent diabetes mellitus.  She is now seeing Dr. Jerilynn Mages. Averneni at [facility].  Dr. Nehemiah Settle had her quit her jardiance about 2 weeks before the sugery 5. Hypercholesterolemia. 6. History of rosacea. 7. Obesity.  She has done a good job trying to lose some weight - she is down 15 pounds from when I last saw her .Marland Kitchen Though she has gained a little through the winter. 8. 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 She is thinking about reitring in early 2020.   Allergies (April Staton, CMA; 12/17/2017 12:19 PM) No Known Drug Allergies [07/01/2017]:  Medication History (April Staton, CMA; 12/17/2017 12:19 PM) Vania Rea (10MG  Tablet, Oral) Active. Bystolic (5MG  Tablet, Oral) Active. Clobetasol Propionate (0.05% Solution, External) Active. Invokana (100MG  Tablet, Oral) Active. Janumet XR (50-1000MG  Tablet ER  24HR, Oral) Active. Levothyroxine Sodium (75MCG Tablet, Oral) Active. Turmeric (1053MG  Tablet, Oral) Active. Multiple Vitamins (Oral) Active. Vitamin D (1000UNIT Tablet, Oral) Active. Medications Reconciled  Vitals (April Staton CMA; 12/17/2017 12:20 PM) 12/17/2017 12:19 PM Weight: 193.13 lb Height: 63.5in Body Surface Area: 1.92 m Body Mass Index: 33.67 kg/m  Pulse: 67 (Regular)  P.OX: 96% (Room air) BP: 122/80 (Sitting, Left Arm, Standard)   Physical Exam  General: WN WF alert and generally healthy appearing. HEENT: Normal. Pupils equal.  Neck: Supple. No mass. No thyroid mass.  Lymph Nodes: No supraclavicular or cervical nodes.  Lungs: Clear to auscultation and symmetric breath sounds. Heart: RRR. No murmur or rub.  Abdomen: Soft. No mass. No tenderness. Normal bowel sounds.  She has a 15 cm bulge in her RLQ - it is difficult for me to feel the edges of the hernia defect.  But the hernia reduces when she lays supine. Rectal: Not done.  Extremities: Good strength and ROM in upper and lower extremities. She had had a biopsy of her right arm at the dermatologist.  Neurologic: Grossly intact to motor and sensory function. Psychiatric: Has normal mood and affect. Behavior is normal.   Assessment & Plan  1.  VENTRAL HERNIA WITHOUT OBSTRUCTION OR GANGRENE (K43.9)  Plan:   1) Plan repair of recurrent lower abdominal wall hernia on 12/22/2017  2.  DIABETES MELLITUS TYPE 2 IN OBESE (E11.69) 3.  HYPERTENSION, BENIGN (I10)   Alphonsa Overall, MD, [facility] Surgery Pager: [phone] Office phone:  [phone]

## 2017-12-22 ENCOUNTER — Inpatient Hospital Stay (HOSPITAL_COMMUNITY)
Admission: RE | Admit: 2017-12-22 | Discharge: 2017-12-27 | DRG: 354 | Disposition: A | Discharge status: Home / Self Care | Payer: 59 | Source: Ambulatory Visit | Attending: Surgery | Admitting: Surgery

## 2017-12-22 ENCOUNTER — Inpatient Hospital Stay (HOSPITAL_COMMUNITY): Payer: 59

## 2017-12-22 ENCOUNTER — Inpatient Hospital Stay (HOSPITAL_COMMUNITY): Payer: 59 | Admitting: Certified Registered Nurse Anesthetist

## 2017-12-22 ENCOUNTER — Other Ambulatory Visit: Payer: Self-pay | Admitting: *Deleted

## 2017-12-22 ENCOUNTER — Other Ambulatory Visit: Payer: Self-pay

## 2017-12-22 ENCOUNTER — Encounter (HOSPITAL_COMMUNITY): Payer: Self-pay

## 2017-12-22 ENCOUNTER — Encounter (HOSPITAL_COMMUNITY)
Admission: RE | Disposition: A | Discharge status: Home / Self Care | Payer: Self-pay | Source: Ambulatory Visit | Attending: Surgery

## 2017-12-22 DIAGNOSIS — I10 Essential (primary) hypertension: Secondary | ICD-10-CM | POA: Diagnosis present

## 2017-12-22 DIAGNOSIS — K429 Umbilical hernia without obstruction or gangrene: Secondary | ICD-10-CM | POA: Diagnosis present

## 2017-12-22 DIAGNOSIS — K567 Ileus, unspecified: Secondary | ICD-10-CM | POA: Diagnosis not present

## 2017-12-22 DIAGNOSIS — E7800 Pure hypercholesterolemia, unspecified: Secondary | ICD-10-CM | POA: Diagnosis present

## 2017-12-22 DIAGNOSIS — K5730 Diverticulosis of large intestine without perforation or abscess without bleeding: Secondary | ICD-10-CM | POA: Diagnosis present

## 2017-12-22 DIAGNOSIS — K432 Incisional hernia without obstruction or gangrene: Principal | ICD-10-CM | POA: Diagnosis present

## 2017-12-22 DIAGNOSIS — Z853 Personal history of malignant neoplasm of breast: Secondary | ICD-10-CM | POA: Diagnosis not present

## 2017-12-22 DIAGNOSIS — Z6834 Body mass index (BMI) 34.0-34.9, adult: Secondary | ICD-10-CM | POA: Diagnosis not present

## 2017-12-22 DIAGNOSIS — E119 Type 2 diabetes mellitus without complications: Secondary | ICD-10-CM | POA: Diagnosis not present

## 2017-12-22 DIAGNOSIS — K660 Peritoneal adhesions (postprocedural) (postinfection): Secondary | ICD-10-CM | POA: Diagnosis present

## 2017-12-22 DIAGNOSIS — E785 Hyperlipidemia, unspecified: Secondary | ICD-10-CM | POA: Diagnosis not present

## 2017-12-22 DIAGNOSIS — E669 Obesity, unspecified: Secondary | ICD-10-CM | POA: Diagnosis present

## 2017-12-22 DIAGNOSIS — K439 Ventral hernia without obstruction or gangrene: Secondary | ICD-10-CM | POA: Diagnosis present

## 2017-12-22 DIAGNOSIS — Z87891 Personal history of nicotine dependence: Secondary | ICD-10-CM | POA: Diagnosis not present

## 2017-12-22 HISTORY — PX: LAPAROSCOPIC ABDOMINAL EXPLORATION: SHX6249

## 2017-12-22 LAB — GLUCOSE, CAPILLARY
GLUCOSE-CAPILLARY: 177 mg/dL — AB (ref 65–99)
GLUCOSE-CAPILLARY: 145 mg/dL — AB (ref 65–99)
Glucose-Capillary: 189 mg/dL — ABNORMAL HIGH (ref 65–99)
Glucose-Capillary: 110 mg/dL — ABNORMAL HIGH (ref 65–99)

## 2017-12-22 SURGERY — EXPLORATION, ABDOMEN, LAPAROSCOPIC
Anesthesia: General

## 2017-12-22 MED ORDER — DIPHENHYDRAMINE HCL 50 MG/ML IJ SOLN
INTRAMUSCULAR | Status: DC | PRN
  Administered 2017-12-22: 12.5 mg via INTRAVENOUS

## 2017-12-22 MED ORDER — LIDOCAINE 2% (20 MG/ML) 5 ML SYRINGE
INTRAMUSCULAR | Status: DC | PRN
  Administered 2017-12-22: 60 mg via INTRAVENOUS

## 2017-12-22 MED ORDER — 0.9 % SODIUM CHLORIDE (POUR BTL) OPTIME
TOPICAL | Status: DC | PRN
  Administered 2017-12-22: 3000 mL

## 2017-12-22 MED ORDER — SCOPOLAMINE 1 MG/3DAYS TD PT72
MEDICATED_PATCH | TRANSDERMAL | Status: AC
  Filled 2017-12-22: qty 1

## 2017-12-22 MED ORDER — SCOPOLAMINE 1 MG/3DAYS TD PT72
MEDICATED_PATCH | TRANSDERMAL | Status: DC | PRN
  Administered 2017-12-22: 1 via TRANSDERMAL

## 2017-12-22 MED ORDER — INSULIN ASPART 100 UNIT/ML ~~LOC~~ SOLN
0.0000 [IU] | SUBCUTANEOUS | Status: DC
  Administered 2017-12-22 – 2017-12-23 (×4): 3 [IU] via SUBCUTANEOUS
  Administered 2017-12-24 – 2017-12-26 (×2): 4 [IU] via SUBCUTANEOUS
  Administered 2017-12-27: 3 [IU] via SUBCUTANEOUS

## 2017-12-22 MED ORDER — PROPOFOL 10 MG/ML IV BOLUS
INTRAVENOUS | Status: AC
  Filled 2017-12-22: qty 20

## 2017-12-22 MED ORDER — CEFAZOLIN SODIUM-DEXTROSE 2-4 GM/100ML-% IV SOLN
2.0000 g | INTRAVENOUS | Status: AC
  Administered 2017-12-22 (×2): 2 g via INTRAVENOUS
  Filled 2017-12-22: qty 100

## 2017-12-22 MED ORDER — KETAMINE HCL 10 MG/ML IJ SOLN
INTRAMUSCULAR | Status: AC
  Filled 2017-12-22: qty 1

## 2017-12-22 MED ORDER — DEXAMETHASONE SODIUM PHOSPHATE 10 MG/ML IJ SOLN
INTRAMUSCULAR | Status: DC | PRN
  Administered 2017-12-22: 5 mg via INTRAVENOUS

## 2017-12-22 MED ORDER — FENTANYL CITRATE (PF) 250 MCG/5ML IJ SOLN
INTRAMUSCULAR | Status: DC | PRN
  Administered 2017-12-22 (×7): 50 ug via INTRAVENOUS

## 2017-12-22 MED ORDER — ONDANSETRON HCL 4 MG/2ML IJ SOLN
INTRAMUSCULAR | Status: AC
  Filled 2017-12-22: qty 2

## 2017-12-22 MED ORDER — FENTANYL CITRATE (PF) 100 MCG/2ML IJ SOLN: 25.0000 ug | INTRAMUSCULAR | Status: DC | PRN

## 2017-12-22 MED ORDER — PROPOFOL 500 MG/50ML IV EMUL
INTRAVENOUS | Status: DC | PRN
  Administered 2017-12-22: 25 ug/kg/min via INTRAVENOUS

## 2017-12-22 MED ORDER — CEFAZOLIN SODIUM-DEXTROSE 2-4 GM/100ML-% IV SOLN
INTRAVENOUS | Status: AC
  Filled 2017-12-22: qty 100

## 2017-12-22 MED ORDER — FENTANYL CITRATE (PF) 100 MCG/2ML IJ SOLN
INTRAMUSCULAR | Status: AC
  Filled 2017-12-22: qty 2

## 2017-12-22 MED ORDER — SUCCINYLCHOLINE CHLORIDE 200 MG/10ML IV SOSY
PREFILLED_SYRINGE | INTRAVENOUS | Status: DC | PRN
  Administered 2017-12-22: 100 mg via INTRAVENOUS

## 2017-12-22 MED ORDER — ROCURONIUM BROMIDE 10 MG/ML (PF) SYRINGE
PREFILLED_SYRINGE | INTRAVENOUS | Status: AC
  Filled 2017-12-22: qty 5

## 2017-12-22 MED ORDER — METOCLOPRAMIDE HCL 5 MG/ML IJ SOLN
INTRAMUSCULAR | Status: AC
Start: 2017-12-22 — End: 2017-12-23
  Filled 2017-12-22: qty 2

## 2017-12-22 MED ORDER — LIP MEDEX EX OINT
TOPICAL_OINTMENT | CUTANEOUS | Status: AC
  Filled 2017-12-22: qty 7

## 2017-12-22 MED ORDER — LIDOCAINE 2% (20 MG/ML) 5 ML SYRINGE
INTRAMUSCULAR | Status: AC
  Filled 2017-12-22: qty 5

## 2017-12-22 MED ORDER — MORPHINE SULFATE (PF) 2 MG/ML IV SOLN
1.0000 mg | INTRAVENOUS | Status: DC | PRN
  Filled 2017-12-22: qty 1

## 2017-12-22 MED ORDER — POTASSIUM CHLORIDE IN NACL 20-0.45 MEQ/L-% IV SOLN
INTRAVENOUS | Status: DC
  Administered 2017-12-22: 17:00:00 via INTRAVENOUS
  Administered 2017-12-22: 1000 mL via INTRAVENOUS
  Administered 2017-12-23 – 2017-12-25 (×4): via INTRAVENOUS
  Filled 2017-12-22 (×9): qty 1000

## 2017-12-22 MED ORDER — SUGAMMADEX SODIUM 200 MG/2ML IV SOLN
INTRAVENOUS | Status: DC | PRN
  Administered 2017-12-22: 200 mg via INTRAVENOUS

## 2017-12-22 MED ORDER — DIPHENHYDRAMINE HCL 50 MG/ML IJ SOLN
INTRAMUSCULAR | Status: AC
  Filled 2017-12-22: qty 1

## 2017-12-22 MED ORDER — MIDAZOLAM HCL 5 MG/5ML IJ SOLN
INTRAMUSCULAR | Status: DC | PRN
  Administered 2017-12-22: 2 mg via INTRAVENOUS

## 2017-12-22 MED ORDER — FENTANYL CITRATE (PF) 250 MCG/5ML IJ SOLN
INTRAMUSCULAR | Status: AC
  Filled 2017-12-22: qty 5

## 2017-12-22 MED ORDER — METOCLOPRAMIDE HCL 5 MG/ML IJ SOLN
10.0000 mg | Freq: Once | INTRAMUSCULAR | Status: AC
  Administered 2017-12-22: 10 mg via INTRAVENOUS

## 2017-12-22 MED ORDER — HEPARIN SODIUM (PORCINE) 5000 UNIT/ML IJ SOLN
5000.0000 [IU] | Freq: Three times a day (TID) | INTRAMUSCULAR | Status: DC
  Administered 2017-12-22 – 2017-12-27 (×14): 5000 [IU] via SUBCUTANEOUS
  Filled 2017-12-22 (×14): qty 1

## 2017-12-22 MED ORDER — DEXAMETHASONE SODIUM PHOSPHATE 10 MG/ML IJ SOLN
INTRAMUSCULAR | Status: AC
  Filled 2017-12-22: qty 1

## 2017-12-22 MED ORDER — ROCURONIUM BROMIDE 50 MG/5ML IV SOSY
PREFILLED_SYRINGE | INTRAVENOUS | Status: DC | PRN
  Administered 2017-12-22: 10 mg via INTRAVENOUS
  Administered 2017-12-22 (×2): 20 mg via INTRAVENOUS
  Administered 2017-12-22: 10 mg via INTRAVENOUS
  Administered 2017-12-22 (×2): 20 mg via INTRAVENOUS
  Administered 2017-12-22: 50 mg via INTRAVENOUS

## 2017-12-22 MED ORDER — HYDROCODONE-ACETAMINOPHEN 5-325 MG PO TABS: 1.0000 | ORAL_TABLET | ORAL | Status: DC | PRN

## 2017-12-22 MED ORDER — LACTATED RINGERS IV SOLN
INTRAVENOUS | Status: DC | PRN
  Administered 2017-12-22 (×4): via INTRAVENOUS

## 2017-12-22 MED ORDER — LIDOCAINE HCL 2 % IJ SOLN
INTRAMUSCULAR | Status: AC
  Filled 2017-12-22: qty 20

## 2017-12-22 MED ORDER — PROPOFOL 10 MG/ML IV BOLUS
INTRAVENOUS | Status: DC | PRN
  Administered 2017-12-22: 150 mg via INTRAVENOUS

## 2017-12-22 MED ORDER — GABAPENTIN 300 MG PO CAPS
300.0000 mg | ORAL_CAPSULE | ORAL | Status: AC
  Administered 2017-12-22: 300 mg via ORAL
  Filled 2017-12-22: qty 1

## 2017-12-22 MED ORDER — BUPIVACAINE HCL (PF) 0.25 % IJ SOLN
INTRAMUSCULAR | Status: AC
  Filled 2017-12-22: qty 30

## 2017-12-22 MED ORDER — ONDANSETRON HCL 4 MG/2ML IJ SOLN
INTRAMUSCULAR | Status: DC | PRN
  Administered 2017-12-22 (×2): 4 mg via INTRAVENOUS

## 2017-12-22 MED ORDER — SUCCINYLCHOLINE CHLORIDE 200 MG/10ML IV SOSY
PREFILLED_SYRINGE | INTRAVENOUS | Status: AC
  Filled 2017-12-22: qty 10

## 2017-12-22 MED ORDER — BUPIVACAINE HCL (PF) 0.25 % IJ SOLN
INTRAMUSCULAR | Status: DC | PRN
  Administered 2017-12-22: 20 mL

## 2017-12-22 MED ORDER — ONDANSETRON HCL 4 MG/2ML IJ SOLN: 4.0000 mg | Freq: Four times a day (QID) | INTRAMUSCULAR | Status: DC | PRN

## 2017-12-22 MED ORDER — BUPIVACAINE LIPOSOME 1.3 % IJ SUSP
20.0000 mL | Freq: Once | INTRAMUSCULAR | Status: AC
  Administered 2017-12-22: 20 mL
  Filled 2017-12-22: qty 20

## 2017-12-22 MED ORDER — CHLORHEXIDINE GLUCONATE CLOTH 2 % EX PADS: 6.0000 | MEDICATED_PAD | Freq: Once | CUTANEOUS | Status: DC

## 2017-12-22 MED ORDER — BUPIVACAINE-EPINEPHRINE (PF) 0.5% -1:200000 IJ SOLN
INTRAMUSCULAR | Status: AC
  Filled 2017-12-22: qty 30

## 2017-12-22 MED ORDER — MIDAZOLAM HCL 2 MG/2ML IJ SOLN
INTRAMUSCULAR | Status: AC
  Filled 2017-12-22: qty 2

## 2017-12-22 MED ORDER — PROPOFOL 10 MG/ML IV BOLUS
INTRAVENOUS | Status: AC
  Filled 2017-12-22: qty 40

## 2017-12-22 MED ORDER — TRAMADOL HCL 50 MG PO TABS
50.0000 mg | ORAL_TABLET | ORAL | Status: DC | PRN
  Administered 2017-12-22 – 2017-12-26 (×14): 50 mg via ORAL
  Filled 2017-12-22 (×16): qty 1

## 2017-12-22 MED ORDER — SUGAMMADEX SODIUM 200 MG/2ML IV SOLN
INTRAVENOUS | Status: AC
  Filled 2017-12-22: qty 2

## 2017-12-22 MED ORDER — ACETAMINOPHEN 500 MG PO TABS
1000.0000 mg | ORAL_TABLET | ORAL | Status: AC
  Administered 2017-12-22: 1000 mg via ORAL
  Filled 2017-12-22: qty 2

## 2017-12-22 MED ORDER — ONDANSETRON 4 MG PO TBDP: 4.0000 mg | ORAL_TABLET | Freq: Four times a day (QID) | ORAL | Status: DC | PRN

## 2017-12-22 MED ORDER — KETAMINE HCL 10 MG/ML IJ SOLN
INTRAMUSCULAR | Status: DC | PRN
  Administered 2017-12-22: 30 mg via INTRAVENOUS

## 2017-12-22 SURGICAL SUPPLY — 66 items
BANDAGE ADH SHEER 1  50/CT (GAUZE/BANDAGES/DRESSINGS) ×15 IMPLANT
BENZOIN TINCTURE PRP APPL 2/3 (GAUZE/BANDAGES/DRESSINGS) ×3 IMPLANT
BINDER ABDOMINAL 12 ML 46-62 (SOFTGOODS) IMPLANT
BIOPATCH WHT 1IN DISK W/4.0 H (GAUZE/BANDAGES/DRESSINGS) ×6 IMPLANT
BLADE HEX COATED 2.75 (ELECTRODE) ×3 IMPLANT
CLOSURE WOUND 1/2 X4 (GAUZE/BANDAGES/DRESSINGS) ×1
COVER SURGICAL LIGHT HANDLE (MISCELLANEOUS) ×3 IMPLANT
DECANTER SPIKE VIAL GLASS SM (MISCELLANEOUS) ×3 IMPLANT
DERMABOND ADVANCED (GAUZE/BANDAGES/DRESSINGS)
DERMABOND ADVANCED .7 DNX12 (GAUZE/BANDAGES/DRESSINGS) IMPLANT
DEVICE TROCAR PUNCTURE CLOSURE (ENDOMECHANICALS) IMPLANT
DISSECTOR BLUNT TIP ENDO 5MM (MISCELLANEOUS) IMPLANT
DISSECTOR ROUND CHERRY 3/8 STR (MISCELLANEOUS) ×3 IMPLANT
DRAIN CHANNEL 19F RND (DRAIN) ×6 IMPLANT
DRAIN CHANNEL RND F F (WOUND CARE) IMPLANT
DRAPE INCISE IOBAN 66X45 STRL (DRAPES) IMPLANT
DRSG OPSITE POSTOP 4X10 (GAUZE/BANDAGES/DRESSINGS) ×3 IMPLANT
DRSG TEGADERM 2-3/8X2-3/4 SM (GAUZE/BANDAGES/DRESSINGS) IMPLANT
DRSG TEGADERM 4X4.75 (GAUZE/BANDAGES/DRESSINGS) ×6 IMPLANT
ELECT PENCIL ROCKER SW 15FT (MISCELLANEOUS) ×3 IMPLANT
ELECT REM PT RETURN 15FT ADLT (MISCELLANEOUS) ×3 IMPLANT
EVACUATOR SILICONE 100CC (DRAIN) ×6 IMPLANT
GAUZE SPONGE 4X4 12PLY STRL (GAUZE/BANDAGES/DRESSINGS) ×6 IMPLANT
GLOVE BIOGEL PI IND STRL 7.0 (GLOVE) ×1 IMPLANT
GLOVE BIOGEL PI INDICATOR 7.0 (GLOVE) ×2
GLOVE SURG SIGNA 7.5 PF LTX (GLOVE) ×3 IMPLANT
GOWN SPEC L4 XLG W/TWL (GOWN DISPOSABLE) ×3 IMPLANT
GOWN STRL REUS W/ TWL XL LVL3 (GOWN DISPOSABLE) ×3 IMPLANT
GOWN STRL REUS W/TWL LRG LVL3 (GOWN DISPOSABLE) ×3 IMPLANT
GOWN STRL REUS W/TWL XL LVL3 (GOWN DISPOSABLE) ×6
KIT BASIN OR (CUSTOM PROCEDURE TRAY) ×3 IMPLANT
MARKER SKIN DUAL TIP RULER LAB (MISCELLANEOUS) ×3 IMPLANT
MESH ULTRAPRO 12X12 30X30CM (Mesh General) ×3 IMPLANT
NEEDLE INSUFFLATION 14GA 150MM (NEEDLE) IMPLANT
NEEDLE SPNL 22GX3.5 QUINCKE BK (NEEDLE) ×3 IMPLANT
NS IRRIG 1000ML POUR BTL (IV SOLUTION) ×3 IMPLANT
PAD POSITIONING PINK XL (MISCELLANEOUS) IMPLANT
PENCIL ELECTRO RS 15 CORD (MISCELLANEOUS) ×3 IMPLANT
POSITIONER SURGICAL ARM (MISCELLANEOUS) IMPLANT
SCISSORS LAP 5X35 DISP (ENDOMECHANICALS) ×3 IMPLANT
SET IRRIG TUBING LAPAROSCOPIC (IRRIGATION / IRRIGATOR) IMPLANT
SHEARS HARMONIC ACE PLUS 36CM (ENDOMECHANICALS) IMPLANT
SOLUTION ANTI FOG 6CC (MISCELLANEOUS) ×3 IMPLANT
SPONGE LAP 18X18 X RAY DECT (DISPOSABLE) ×9 IMPLANT
STAPLER VISISTAT 35W (STAPLE) ×3 IMPLANT
STRIP CLOSURE SKIN 1/2X4 (GAUZE/BANDAGES/DRESSINGS) ×2 IMPLANT
SUT ETHILON 2 0 PS N (SUTURE) ×6 IMPLANT
SUT ETHILON 3 0 PS 1 (SUTURE) IMPLANT
SUT MNCRL AB 4-0 PS2 18 (SUTURE) IMPLANT
SUT NOV 1 T60/GS (SUTURE) ×3 IMPLANT
SUT NOVA 0 T19/GS 22DT (SUTURE) ×6 IMPLANT
SUT NOVA NAB DX-16 0-1 5-0 T12 (SUTURE) ×6 IMPLANT
SUT NOVA NAB GS-21 1 T12 (SUTURE) ×3 IMPLANT
SUT PDS AB 1 CT1 27 (SUTURE) ×6 IMPLANT
SUT VIC AB 0 CT1 27 (SUTURE) ×12
SUT VIC AB 0 CT1 27XBRD ANTBC (SUTURE) ×6 IMPLANT
SUT VIC AB 2-0 SH 18 (SUTURE) ×12 IMPLANT
TACKER 5MM HERNIA 3.5CML NAB (ENDOMECHANICALS) ×3 IMPLANT
TAPE CLOTH 4X10 WHT NS (GAUZE/BANDAGES/DRESSINGS) ×3 IMPLANT
TOWEL OR 17X26 10 PK STRL BLUE (TOWEL DISPOSABLE) ×3 IMPLANT
TRAY FOLEY BAG SILVER LF 14FR (CATHETERS) ×3 IMPLANT
TRAY LAPAROSCOPIC (CUSTOM PROCEDURE TRAY) ×3 IMPLANT
TROCAR ADV FIXATION 5X100MM (TROCAR) ×3 IMPLANT
TROCAR XCEL NON-BLD 11X100MML (ENDOMECHANICALS) ×3 IMPLANT
TROCAR XCEL UNIV SLVE 11M 100M (ENDOMECHANICALS) ×9 IMPLANT
TUBING INSUF HEATED (TUBING) ×3 IMPLANT

## 2017-12-22 NOTE — Interval H&P Note (Signed)
History and Physical Interval Note:  12/22/2017 7:20 AM  Susan Montes  has presented today for surgery, with the diagnosis of Recurrent lower abd wall hernia  The various methods of treatment have been discussed with the patient and family.  Husband at bedside.  After consideration of risks, benefits and other options for treatment, the patient has consented to  Procedure(s): LAPAROSCOPIC/OPEN LOWER ABDOMINAL WALL HERNIA ERAS PATHWAY (N/A) as a surgical intervention .  The patient's history has been reviewed, patient examined, no change in status, stable for surgery.  I have reviewed the patient's chart and labs.  Questions were answered to the patient's satisfaction.     Shann Medal

## 2017-12-22 NOTE — Transfer of Care (Signed)
Immediate Anesthesia Transfer of Care Note  Patient: Susan Montes  Procedure(s) Performed: LAPAROSCOPIC AND OPEN LOWER ABDOMINAL WALL HERNIA, WITH REMOVAL OF MESH ERAS PATHWAY (N/A )  Patient Location: PACU  Anesthesia Type:General  Level of Consciousness: awake, alert  and oriented  Airway & Oxygen Therapy: Patient Spontanous Breathing and Patient connected to face mask oxygen  Post-op Assessment: Report given to RN and Post -op Vital signs reviewed and stable  Post vital signs: Reviewed and stable  Last Vitals:  Vitals Value Taken Time  BP 112/73 12/22/2017 12:45 PM  Temp    Pulse 77 12/22/2017 12:47 PM  Resp 14 12/22/2017 12:47 PM  SpO2 100 % 12/22/2017 12:47 PM  Vitals shown include unvalidated device data.  Last Pain:  Vitals:   12/22/17 0611  TempSrc: Oral  PainSc:          Complications: No apparent anesthesia complications

## 2017-12-22 NOTE — Op Note (Addendum)
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  ASSISTANT:   B. Hoxworth, M.D.  ANESTHESIA:   general  Anesthesiologist: Belinda Block, MD; Myrtie Soman, MD CRNA: Victoriano Lain, CRNA; [place] Stephanie C, CRNA; Maxwell Caul, CRNA  General  EBL:  250  ml  BLOOD ADMINISTERED: none  DRAINS: 2 Blake drains  LOCAL MEDICATIONS USED:   20 cc of Exparel and 20 cc of 1/4% marcaine  SPECIMEN:   None  COUNTS CORRECT:  YES  INDICATIONS FOR PROCEDURE:  Susan Montes is a 67 y.o. (DOB: 04/25/1951) white female whose primary care physician is Jani Gravel, MD and comes for repair of recurrent ventral incisional hernia.     [name] had a hysterectomy in Nov 2008 for a left ovarian mass.  She had a rapid presentation of a lower midline abdominal wall hernia.  I did a laparoscopic repair of the hernia on 10/06/2007 using a 20 x 25 cm piece of Parietex mesh. That repair failed.  I did a second repair - which was a repair of the intact mesh on 09/30/2008.  That repair failed.   She now comes for another attempt at the hernia repair.  The indications and risks of the surgery were explained to the patient.  The risks include, but are not limited to, infection, bleeding, and nerve injury.  She also understands that each repair attempt has a higher likelihood of failure than the previous repair attempt.  PROCEDURE:   The patient was taken to room #4 at [facility].  She underwent a general  endotracheal anesthetic.  Her abdomen and pubic area were prepped with ChloraPrep and Betadine.  The abdomen was sterilely draped.  She had a Foley catheter in place.   A timeout was held the surgical checklist run.   I first accessed her left upper quadrant with a 5 mm Ethicon trocar.  I placed a second 5 mm trocar in the right upper quadrant.  This gave me access to her abdominal cavity.  She had a 2 cm umbilical hernia above the prior mesh repair. She had adhesions to the Parietex mesh which I took down.  The Parietex mesh was in good shape except at the bottom of the mesh where there is an approximate 5 cm hole in the mesh.  Photos are taken and in the chart. This was primary source of failure of her prior hernia surgery and led to the bulging hernia sac in left lower abdomen.  There was no bowel through this hole.  But the defect in the mesh clearly led to the palpable mass the patient felt in her right lower quadrant.  It seemed impractical to leave the mesh intact because of the prior failure.   I made a midline incision and carried this down into the abdominal cavity.  Because the left side had more normal anatomy, I did a retro-rectus dissection freeing up the rectus muscle anteriorly exposing the posterior rectus fascia.  I took this dissection from about 4 cm above the  umbilicus (to include the umbilical hernia) down to the pubic bone.  I then did the same retrorectus dissection on the right side.  Though about halfway between the umbilicus and the pubic bone the right rectus muscle was pushed lateral from the prior recurrent hernia.  On the right side I did a transversus abdominis release (TAR dissection) to get out lateral to the right flank and to bring the right rectus muscle back to midline.  I also carried this dissection down to the pubic bone.   During the retro-rectus dissection, I removed over 95% of the old Parietex mesh.  There were areas that the mesh was incorporated enough to act as  scaffolding for the abdominal repair and in these areas, I left some small pieces of Parietex.   After doing this dissection, I made sure I had adequate space behind the pubic bone exposing Cooper's ligament bilaterally.  I then closed the peritoneum from above the umbilicus down to just above the pubic bone using multiple running 0 Vicryl sutures.   I then used a 30 cm x 30 cm Ethicon Ultrapro mesh that I turned to a diamond shape.  This mesh was placed on top of the peritoneum and underneath the rectus abdominis muscle dissection.   Around this time, there was noted to be an incorrect lap count.  I had to open the lower peritoneal closure and retrieved a single lap from the pelvis.  I then reclosed the peritoneum with 0 Vicryl suture.   I then sewed the ultra Pro mesh to the pubic bone and both Cooper's ligament with #1 Novafil.   I sewed it more laterally to inguinal ligamet on both sides with 0 Novafil suture.  I sewed it inside the right anterior iliac spine with a #1 Novafil suture.  I had two lateral 0 Novafil stay sutures on both sides to hold the mesh.  And I had one 0 Novafil stay suture above the umbilicus.  The mesh lay flat.  I irrigated 2 liters of saline over the mesh.  I then placed a 37 F Blake drain from the left mid abdomen over the mesh.   I then closed the rectus muscle in the midline using 2 #1 Novafil running sutures with some interrupted #1 PDS sutures.  This gave a good closure of the abdominal wall over the mesh.   I then stripped the sac out of the right lower quadrant hernia area.  I placed a second 62 [place] Blake drain on the right side into this cavity and over the lower part of the midline closure.  I then try to close some of this cavity with 2-0 Vicryl sutures.  I closed the midline fatty tissue with interrupted 2-0 Vicryl sutures.  I closed the skin with a staples.  Both drains had a 2-0 nylon suture holding it in place.   I removed the 5 mm trochars.  I closed the  skin with 4-0 Monocryl suture and painted the wound with Dermabond.  The drains and abdominal wound was then sterilely dressed.  The sponge and needle count were correct at the end of the case.   I have a surgeon as a first assist to retract, expose, and assist on this difficult operation.  The patient tolerated the procedure well.  She was transferred to the recovery room in good condition.   Type of repair - Component separation with mesh  (choices - primary suture, mesh, or component)  Name of mesh - Ethicon Ultrapro  Size of mesh - Length 30 cm, Width 30 cm  Mesh overlap - 5 cm  Placement of mesh - beneath fascia but external to peritoneal cavity  (choices - beneath fascia and into peritoneal cavity, beneath fascia but external to peritoneal cavity, between the muscle and fascia, above or external to fascia)  Alphonsa Overall, MD, [facility] Surgery Pager: [phone] Office phone:  [phone]

## 2017-12-22 NOTE — Anesthesia Procedure Notes (Signed)
Procedure Name: Intubation Date/Time: 12/22/2017 7:41 AM Performed by: Maxwell Caul, CRNA Pre-anesthesia Checklist: Patient identified, Emergency Drugs available, Suction available and Patient being monitored Patient Re-evaluated:Patient Re-evaluated prior to induction Oxygen Delivery Method: Circle system utilized Preoxygenation: Pre-oxygenation with 100% oxygen Induction Type: IV induction Ventilation: Mask ventilation without difficulty Laryngoscope Size: Mac and 4 Grade View: Grade I Tube type: Oral Tube size: 7.5 mm Number of attempts: 1 Airway Equipment and Method: Stylet Placement Confirmation: ETT inserted through vocal cords under direct vision,  positive ETCO2 and breath sounds checked- equal and bilateral Secured at: 21 cm Tube secured with: Tape Dental Injury: Teeth and Oropharynx as per pre-operative assessment

## 2017-12-22 NOTE — Patient Outreach (Signed)
[facility]) Care Management  12/22/2017  Susan Montes 07-09-1951 [id number]  Subjective: Received voicemail message from Ms. Shelia Bonds, states she is returning call, will be unavailable for return call, going to have surgery on 12/22/17, estimated hospital length of stay 3 -5 days, if call back needed, can call home number, and leave a message with her husband Timmothy Sours.    Objective:  Per KPN (Knowledge Performance Now, point of care tool) and chart review, patient to be admitted 12/22/17 for LAPAROSCOPIC/OPEN LOWER ABDOMINAL WALL HERNIA ERAS PATHWAY at [facility].    Patient also has a history of diabetes.     Assessment: Received [facility] Preoperative / Transition of care referral on 12/05/17.     Preoperative call follow up not completed due to unable to contact patient.   Ttransition of care follow up pending notification of patient discharge.     Plan: RNCM will call patient for  telephone outreach attempt, transition of care follow up, within 3 business days of hospital discharge notification.   RNCM will send unsuccessful outreach  letter, [facility] pamphlet, and proceed with case closure, within 10 business days if no return call after 3 attempts for transition of care follow up post hospital discharge.      Venora Kautzman H. Annia Friendly, BSN, [place] Management [facility] Telephonic CM Phone: [phone] Fax: [phone]

## 2017-12-22 NOTE — Progress Notes (Signed)
Called MD concerning pt stating that she could not take Morphine nor Norco. Orders were given.

## 2017-12-23 LAB — CBC
HEMATOCRIT: 34.7 % — AB (ref 36.0–46.0)
Hemoglobin: 11.3 g/dL — ABNORMAL LOW (ref 12.0–15.0)
MCH: 30.5 pg (ref 26.0–34.0)
MCHC: 32.6 g/dL (ref 30.0–36.0)
MCV: 93.8 fL (ref 78.0–100.0)
PLATELETS: 211 10*3/uL (ref 150–400)
RBC: 3.7 MIL/uL — AB (ref 3.87–5.11)
RDW: 13.2 % (ref 11.5–15.5)
WBC: 10.9 10*3/uL — AB (ref 4.0–10.5)

## 2017-12-23 LAB — BASIC METABOLIC PANEL
ANION GAP: 12 (ref 5–15)
BUN: 10 mg/dL (ref 6–20)
CO2: 24 mmol/L (ref 22–32)
Calcium: 8.6 mg/dL — ABNORMAL LOW (ref 8.9–10.3)
Chloride: 104 mmol/L (ref 101–111)
Creatinine, Ser: 0.62 mg/dL (ref 0.44–1.00)
GFR calc Af Amer: >60 mL/min (ref >60)
GFR calc non Af Amer: >60 mL/min (ref >60)
GLUCOSE: 113 mg/dL — AB (ref 65–99)
POTASSIUM: 4.1 mmol/L (ref 3.5–5.1)
Sodium: 140 mmol/L (ref 135–145)

## 2017-12-23 LAB — GLUCOSE, CAPILLARY
GLUCOSE-CAPILLARY: 101 mg/dL — AB (ref 65–99)
Glucose-Capillary: 111 mg/dL — ABNORMAL HIGH (ref 65–99)
Glucose-Capillary: 115 mg/dL — ABNORMAL HIGH (ref 65–99)
Glucose-Capillary: 116 mg/dL — ABNORMAL HIGH (ref 65–99)
Glucose-Capillary: 121 mg/dL — ABNORMAL HIGH (ref 65–99)
Glucose-Capillary: 123 mg/dL — ABNORMAL HIGH (ref 65–99)
Glucose-Capillary: 126 mg/dL — ABNORMAL HIGH (ref 65–99)

## 2017-12-23 MED ORDER — ACETAMINOPHEN 325 MG PO TABS
650.0000 mg | ORAL_TABLET | Freq: Three times a day (TID) | ORAL | Status: DC
  Administered 2017-12-23 – 2017-12-27 (×13): 650 mg via ORAL
  Filled 2017-12-23 (×13): qty 2

## 2017-12-23 MED ORDER — PROMETHAZINE HCL 25 MG/ML IJ SOLN
12.5000 mg | Freq: Four times a day (QID) | INTRAMUSCULAR | Status: DC | PRN
  Administered 2017-12-23: 12.5 mg via INTRAVENOUS
  Filled 2017-12-23: qty 1

## 2017-12-23 NOTE — Consult Note (Signed)
   [facility] CM Inpatient Consult   12/23/2017  Susan Montes [birth year] [id number]   Came to bedside to speak with [name] on behalf of [employer] for [employer] employees/dependents with [facility] insurance.   [name] was drowsy. Bedside conversation was brief. Discussed post follow up call from [place]. Provided 24-hr nurse advice line magnet and contact information.    Marthenia Rolling, MSN-Ed, RN,BSN [facility] Liaison [phone]

## 2017-12-23 NOTE — Anesthesia Postprocedure Evaluation (Signed)
Anesthesia Post Note  Patient: Susan Montes  Procedure(s) Performed: LAPAROSCOPIC AND OPEN LOWER ABDOMINAL WALL HERNIA, WITH REMOVAL OF MESH ERAS PATHWAY (N/A )     Patient location during evaluation: PACU Anesthesia Type: General Level of consciousness: awake and alert Pain management: pain level controlled Vital Signs Assessment: post-procedure vital signs reviewed and stable Respiratory status: spontaneous breathing, nonlabored ventilation, respiratory function stable and patient connected to nasal cannula oxygen Cardiovascular status: blood pressure returned to baseline and stable Postop Assessment: no apparent nausea or vomiting Anesthetic complications: no    Last Vitals:  Vitals:   12/23/17 0236 12/23/17 0542  BP: 118/60 (!) 112/55  Pulse: 70 70  Resp: 16 18  Temp: 37 C 37.1 C  SpO2: 99% 98%    Last Pain:  Vitals:   12/23/17 0639  TempSrc:   PainSc: 2                  Ulice Follett S

## 2017-12-23 NOTE — Progress Notes (Signed)
[facility] Office:  [phone] General Surgery Progress Note   LOS: 1 day  POD -  1 Day Post-Op  Chief Complaint: Ventral hernia  Assessment and Plan: 1.  LAPAROSCOPIC exploration and enterolysis, OPEN LOWER ABDOMINAL WALL HERNIA, WITH REMOVAL OF MESH - 12/22/2017 - D. Maecy Podgurski  Doing okay.  Wants to start clear liquids.  Needs to ambulate more.  2.  DIABETES MELLITUS TYPE 2 IN OBESE (E11.69) 3.  HYPERTENSION, BENIGN (I10)  4.  DVT prophylaxis - SQ Heparin 5.  D/C foley   Active Problems:   Ventral hernia without obstruction or gangrene  Subjective:  Doing okay.  Less nausea and feels better this AM  Objective:   Vitals:   12/23/17 0236 12/23/17 0542  BP: 118/60 (!) 112/55  Pulse: 70 70  Resp: 16 18  Temp: 98.6 F (37 C) 98.7 F (37.1 C)  SpO2: 99% 98%     Intake/Output from previous day:  04/15 0701 - 04/16 0700 In: 4631.3 [I.V.:4631.3] Out: 3507 [Urine:3000; Drains:257; Blood:250]  Intake/Output this shift:  No intake/output data recorded.   Physical Exam:   General: WN WF who is alert and oriented.    HEENT: Normal. Pupils equal. .   Lungs: Clear.  Good inspiratory effort   Abdomen: Rare BS   Wound: Dressing intact.  Drains - L/R - 169/88 cc   Lab Results:    Recent Labs    12/23/17 0434  WBC 10.9*  HGB 11.3*  HCT 34.7*  PLT 211    BMET   Recent Labs    12/23/17 0434  NA 140  K 4.1  CL 104  CO2 24  GLUCOSE 113*  BUN 10  CREATININE 0.62  CALCIUM 8.6*    PT/INR  No results for input(s): LABPROT, INR in the last 72 hours.  ABG  No results for input(s): PHART, HCO3 in the last 72 hours.  Invalid input(s): PCO2, PO2   Studies/Results:  Dg C-arm 1-60 Min-no Report  Result Date: 12/22/2017 Fluoroscopy was utilized by the requesting physician.  No radiographic interpretation.     Anti-infectives:   Anti-infectives (From admission, onward)   Start     Dose/Rate Route Frequency Ordered Stop   12/22/17 1145  ceFAZolin  (ANCEF) 2-4 GM/100ML-% IVPB    Note to Pharmacy:  Waldron Session   : cabinet override      12/22/17 1145 12/22/17 2359   12/22/17 0544  ceFAZolin (ANCEF) IVPB 2g/100 mL premix     2 g 200 mL/hr over 30 Minutes Intravenous On call to O.R. 12/22/17 0544 12/22/17 1205      Alphonsa Overall, MD, FACS Pager: [facility] Office: [phone] 12/23/2017

## 2017-12-24 LAB — CBC
HEMATOCRIT: 33.5 % — AB (ref 36.0–46.0)
Hemoglobin: 11.1 g/dL — ABNORMAL LOW (ref 12.0–15.0)
MCH: 31.1 pg (ref 26.0–34.0)
MCHC: 33.1 g/dL (ref 30.0–36.0)
MCV: 93.8 fL (ref 78.0–100.0)
Platelets: 169 10*3/uL (ref 150–400)
RBC: 3.57 MIL/uL — ABNORMAL LOW (ref 3.87–5.11)
RDW: 13.1 % (ref 11.5–15.5)
WBC: 9.6 10*3/uL (ref 4.0–10.5)

## 2017-12-24 LAB — BASIC METABOLIC PANEL
ANION GAP: 10 (ref 5–15)
BUN: 9 mg/dL (ref 6–20)
CALCIUM: 8.5 mg/dL — AB (ref 8.9–10.3)
CO2: 25 mmol/L (ref 22–32)
Chloride: 103 mmol/L (ref 101–111)
Creatinine, Ser: 0.59 mg/dL (ref 0.44–1.00)
GFR calc Af Amer: >60 mL/min (ref >60)
Glucose, Bld: 118 mg/dL — ABNORMAL HIGH (ref 65–99)
Potassium: 4 mmol/L (ref 3.5–5.1)
SODIUM: 138 mmol/L (ref 135–145)

## 2017-12-24 LAB — GLUCOSE, CAPILLARY
GLUCOSE-CAPILLARY: 108 mg/dL — AB (ref 65–99)
GLUCOSE-CAPILLARY: 113 mg/dL — AB (ref 65–99)
GLUCOSE-CAPILLARY: 160 mg/dL — AB (ref 65–99)
GLUCOSE-CAPILLARY: 85 mg/dL (ref 65–99)
Glucose-Capillary: 108 mg/dL — ABNORMAL HIGH (ref 65–99)
Glucose-Capillary: 114 mg/dL — ABNORMAL HIGH (ref 65–99)

## 2017-12-24 NOTE — Progress Notes (Signed)
[facility] Office:  [phone] General Surgery Progress Note   LOS: 2 days  POD -  2 Days Post-Op  Chief Complaint: Ventral hernia  Assessment and Plan: 1.  LAPAROSCOPIC exploration and enterolysis, OPEN LOWER ABDOMINAL WALL HERNIA, WITH REMOVAL OF MESH - 12/22/2017 - D. [employer] better, but no bowl function.  Will keep at clear liquids.  She is ambulating well.  2.  DIABETES MELLITUS TYPE 2 IN OBESE (E11.69) 3.  HYPERTENSION, BENIGN (I10)  4.  DVT prophylaxis - SQ Heparin   Active Problems:   Ventral hernia without obstruction or gangrene  Subjective:  Looks better.  Less nausea.  No flatus.  Objective:   Vitals:   12/23/17 2139 12/24/17 0453  BP: 129/72 131/68  Pulse: 81 79  Resp: 14 13  Temp: 100 F (37.8 C) 98.8 F (37.1 C)  SpO2: 97% 96%     Intake/Output from previous day:  04/16 0701 - 04/17 0700 In: 2018.8 [P.O.:60; I.V.:1958.8] Out: 2155 [Urine:1950; Drains:205]  Intake/Output this shift:  Total I/O In: -  Out: 650 [Urine:650]   Physical Exam:   General: WN WF who is alert and oriented.  Looks better.   HEENT: Normal. Pupils equal. .   Lungs: Clear.  Good inspiratory effort.  IS = 1,000cc   Abdomen: Rare BS   Wound: Dressing intact.  Drains - L/R - 150/55 cc   Lab Results:    Recent Labs    12/23/17 0434 12/24/17 0434  WBC 10.9* 9.6  HGB 11.3* 11.1*  HCT 34.7* 33.5*  PLT 211 169    BMET   Recent Labs    12/23/17 0434 12/24/17 0434  NA 140 138  K 4.1 4.0  CL 104 103  CO2 24 25  GLUCOSE 113* 118*  BUN 10 9  CREATININE 0.62 0.59  CALCIUM 8.6* 8.5*    PT/INR  No results for input(s): LABPROT, INR in the last 72 hours.  ABG  No results for input(s): PHART, HCO3 in the last 72 hours.  Invalid input(s): PCO2, PO2   Studies/Results:  Dg C-arm 1-60 Min-no Report  Result Date: 12/22/2017 Fluoroscopy was utilized by the requesting physician.  No radiographic interpretation.      Anti-infectives:   Anti-infectives (From admission, onward)   Start     Dose/Rate Route Frequency Ordered Stop   12/22/17 1145  ceFAZolin (ANCEF) 2-4 GM/100ML-% IVPB    Note to Pharmacy:  Waldron Session   : cabinet override      12/22/17 1145 12/22/17 2359   12/22/17 0544  ceFAZolin (ANCEF) IVPB 2g/100 mL premix     2 g 200 mL/hr over 30 Minutes Intravenous On call to O.R. 12/22/17 0544 12/22/17 1205      Alphonsa Overall, MD, FACS Pager: [facility] Office: [phone] 12/24/2017

## 2017-12-25 LAB — GLUCOSE, CAPILLARY
GLUCOSE-CAPILLARY: 98 mg/dL (ref 65–99)
GLUCOSE-CAPILLARY: 97 mg/dL (ref 65–99)
GLUCOSE-CAPILLARY: 111 mg/dL — AB (ref 65–99)
Glucose-Capillary: 107 mg/dL — ABNORMAL HIGH (ref 65–99)
Glucose-Capillary: 104 mg/dL — ABNORMAL HIGH (ref 65–99)
Glucose-Capillary: 115 mg/dL — ABNORMAL HIGH (ref 65–99)

## 2017-12-25 NOTE — Progress Notes (Signed)
[facility] Office:  [phone] General Surgery Progress Note   LOS: 3 days  POD -  3 Days Post-Op  Chief Complaint: Ventral hernia  Assessment and Plan: 1.  LAPAROSCOPIC exploration and enterolysis, OPEN LOWER ABDOMINAL WALL HERNIA, WITH REMOVAL OF MESH - 12/22/2017 - D. Christeena Krogh  Passed flatus.  Will start full liquids.  Ambulating well  2.  DIABETES MELLITUS TYPE 2 IN OBESE (E11.69) 3.  HYPERTENSION, BENIGN (I10) 4.  DVT prophylaxis - SQ Heparin   Active Problems:   Ventral hernia without obstruction or gangrene  Subjective:  Continues to improve.  Passed flatus.  She is hungry.  Objective:   Vitals:   12/24/17 2006 12/25/17 0349  BP: 127/74 127/71  Pulse: 79 78  Resp: 17 14  Temp: 98.9 F (37.2 C) 99.5 F (37.5 C)  SpO2: 100% 97%     Intake/Output from previous day:  04/17 0701 - 04/18 0700 In: 3470 [P.O.:770; I.V.:2700] Out: 2315 [Urine:2200; Drains:115]  Intake/Output this shift:  No intake/output data recorded.   Physical Exam:   General: WN WF who is alert and oriented.     HEENT: Normal. Pupils equal. .   Lungs: Clear.  Good inspiratory effort.     Abdomen: Has BS   Wound: Dressing intact.  Drains - L/R - 80/35 cc   Lab Results:    Recent Labs    12/23/17 0434 12/24/17 0434  WBC 10.9* 9.6  HGB 11.3* 11.1*  HCT 34.7* 33.5*  PLT 211 169    BMET   Recent Labs    12/23/17 0434 12/24/17 0434  NA 140 138  K 4.1 4.0  CL 104 103  CO2 24 25  GLUCOSE 113* 118*  BUN 10 9  CREATININE 0.62 0.59  CALCIUM 8.6* 8.5*    PT/INR  No results for input(s): LABPROT, INR in the last 72 hours.  ABG  No results for input(s): PHART, HCO3 in the last 72 hours.  Invalid input(s): PCO2, PO2   Studies/Results:  No results found.   Anti-infectives:   Anti-infectives (From admission, onward)   Start     Dose/Rate Route Frequency Ordered Stop   12/22/17 1145  ceFAZolin (ANCEF) 2-4 GM/100ML-% IVPB    Note to Pharmacy:  Waldron Session    : cabinet override      12/22/17 1145 12/22/17 2359   12/22/17 0544  ceFAZolin (ANCEF) IVPB 2g/100 mL premix     2 g 200 mL/hr over 30 Minutes Intravenous On call to O.R. 12/22/17 0544 12/22/17 1205      Alphonsa Overall, MD, FACS Pager: [facility] Office: [phone] 12/25/2017

## 2017-12-26 LAB — GLUCOSE, CAPILLARY
GLUCOSE-CAPILLARY: 112 mg/dL — AB (ref 65–99)
GLUCOSE-CAPILLARY: 106 mg/dL — AB (ref 65–99)
Glucose-Capillary: 98 mg/dL (ref 65–99)
Glucose-Capillary: 113 mg/dL — ABNORMAL HIGH (ref 65–99)
Glucose-Capillary: 152 mg/dL — ABNORMAL HIGH (ref 65–99)
Glucose-Capillary: 81 mg/dL (ref 65–99)

## 2017-12-26 NOTE — Progress Notes (Signed)
[facility] Office:  [phone] General Surgery Progress Note   LOS: 4 days  POD -  4 Days Post-Op  Chief Complaint: Ventral hernia  Assessment and Plan: 1.  LAPAROSCOPIC exploration and enterolysis, OPEN LOWER ABDOMINAL WALL HERNIA, WITH REMOVAL OF MESH - 12/22/2017 - D. Liona Wengert  Removed LLQ drain.  To advance to reg diet  Plan:  Advance to reg diet, remove right lower quadrant drain on Saturday, possibly home Saturday  2.  DIABETES MELLITUS TYPE 2 IN OBESE (E11.69) 3.  HYPERTENSION, BENIGN (I10) 4.  DVT prophylaxis - SQ Heparin   Active Problems:   Ventral hernia without obstruction or gangrene  Subjective:  Ambulating well.  Still passing only flatus.  Hungry for more food.  Objective:   Vitals:   12/25/17 2131 12/26/17 0550  BP: 139/76 120/73  Pulse: 79 74  Resp: 18 16  Temp: 98.9 F (37.2 C) 98.9 F (37.2 C)  SpO2: 97% 97%     Intake/Output from previous day:  04/18 0701 - 04/19 0700 In: 2490 [P.O.:1140; I.V.:1350] Out: 650 [Urine:600; Drains:50]  Intake/Output this shift:  No intake/output data recorded.   Physical Exam:   General: WN WF who is alert and oriented.     HEENT: Normal. Pupils equal. .   Lungs: Clear.  Good inspiratory effort.     Abdomen: Has good BS   Wound: Dressing intact.  Drains - L/R - 30/20 cc I removed the LLQ drain today.   Lab Results:    Recent Labs    12/24/17 0434  WBC 9.6  HGB 11.1*  HCT 33.5*  PLT 169    BMET   Recent Labs    12/24/17 0434  NA 138  K 4.0  CL 103  CO2 25  GLUCOSE 118*  BUN 9  CREATININE 0.59  CALCIUM 8.5*    PT/INR  No results for input(s): LABPROT, INR in the last 72 hours.  ABG  No results for input(s): PHART, HCO3 in the last 72 hours.  Invalid input(s): PCO2, PO2   Studies/Results:  No results found.   Anti-infectives:   Anti-infectives (From admission, onward)   Start     Dose/Rate Route Frequency Ordered Stop   12/22/17 1145  ceFAZolin (ANCEF) 2-4  GM/100ML-% IVPB    Note to Pharmacy:  Waldron Session   : cabinet override      12/22/17 1145 12/22/17 2359   12/22/17 0544  ceFAZolin (ANCEF) IVPB 2g/100 mL premix     2 g 200 mL/hr over 30 Minutes Intravenous On call to O.R. 12/22/17 0544 12/22/17 1205      Alphonsa Overall, MD, FACS Pager: [facility] Office: [phone] 12/26/2017

## 2017-12-27 LAB — GLUCOSE, CAPILLARY
Glucose-Capillary: 118 mg/dL — ABNORMAL HIGH (ref 65–99)
Glucose-Capillary: 126 mg/dL — ABNORMAL HIGH (ref 65–99)

## 2017-12-27 MED ORDER — TRAMADOL HCL 50 MG PO TABS: 50.0000 mg | ORAL_TABLET | Freq: Four times a day (QID) | ORAL | 0 refills | Status: DC | PRN

## 2017-12-27 NOTE — Discharge Summary (Signed)
Physician Discharge Summary  Patient ID: Susan Montes MRN: [id number] DOB/AGE: 65-Dec-1952 65 y.o.  Admit date: 12/22/2017 Discharge date: 12/27/2017  Admission Diagnoses: Patient Active Problem List   Diagnosis Date Noted  . Ventral hernia without obstruction or gangrene 12/22/2017  . 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 Baker's cyst 11/18/2014  . Diabetes ([place]) 12/01/2012  . Right ankle injury 01/07/2012  . Ventral hernia, recurrent. 07/24/2011    Discharge Diagnoses:  Active Problems:   Ventral hernia without obstruction or gangrene and same  Discharged Condition: stable  Hospital Course:  Pt was admitted to the floor following lap assisted ventral hernia repair with removal of prior mesh 12/22/17 by Dr. Alphonsa Overall.  She had some anticipated nausea on POD 0-1 that improved rapidly.  She had several days of anticipated ileus.  She started passing gas on POD 3.  Her diet was able to be advanced.  She had good pain control with minimal medication and tylenol.  She was able to ambulate and void without foley.  Her drains have minimal output and were removed prior to d/c.  She was wearing the abdominal binder religiously.  She is discharged to home in stable condition.    Consults: None  Significant Diagnostic Studies: labs: see epic.  Sl anemia with HCT 33.5.    Treatments: surgery: see above.    Discharge Exam: Blood pressure 121/76, pulse 80, temperature 98.6 F (37 C), temperature source Oral, resp. rate 16, height 5\' 3"  (1.6 m), weight 88.9 kg (196 lb), SpO2 98 %. General appearance: alert, cooperative and no distress Resp: breathing comfortably Cardio: reg rate GI: soft, non tender, non distended.  binder in place.  honeycomb dressing showed no drainage or erythema of wound.  drain serosang, low volume. Extremities: extremities normal, atraumatic, no cyanosis or edema  Disposition: Discharge disposition: 01-Home or Self  Care       Discharge Instructions    Call MD for:  persistant nausea and vomiting   Complete by:  As directed    Call MD for:  redness, tenderness, or signs of infection (pain, swelling, redness, odor or green/yellow discharge around incision site)   Complete by:  As directed    Call MD for:  severe uncontrolled pain   Complete by:  As directed    Call MD for:  temperature >100.4   Complete by:  As directed    Diet - low sodium heart healthy   Complete by:  As directed    Increase activity slowly   Complete by:  As directed    Remove dressing in 24 hours   Complete by:  As directed      Allergies as of 12/27/2017      Reactions   Anesthetics, Amide Nausea And Vomiting   Statins    Myalgia, Headache      Medication List    TAKE these medications   acetaminophen 500 MG tablet Commonly known as:  TYLENOL Take 1,000 mg every 6 (six) hours as needed by mouth for moderate pain or headache.   BC FAST PAIN RELIEF PO Take 1 packet by mouth daily as needed (pain).   calcium carbonate 500 MG chewable tablet Commonly known as:  TUMS - dosed in mg elemental calcium Chew 1-2 tablets by mouth daily as needed for indigestion or heartburn.   clobetasol cream 0.05 % Commonly known as:  TEMOVATE Apply 1 application topically 2 (two) times daily as needed (for eczema).   clobetasol  0.05 % external solution Commonly known as:  TEMOVATE Apply 1 application daily as needed topically (for scalp).   ibuprofen 200 MG tablet Commonly known as:  ADVIL,MOTRIN Take 400 mg every 6 (six) hours as needed by mouth for headache or moderate pain.   JANUMET XR 50-1000 MG Tb24 Generic drug:  SitaGLIPtin-MetFORMIN HCl Take 1 tablet 2 (two) times daily by mouth.   JARDIANCE 10 MG Tabs tablet Generic drug:  empagliflozin Take 10 mg daily by mouth.   levothyroxine 75 MCG tablet Commonly known as:  SYNTHROID, LEVOTHROID Take 75 mcg daily before breakfast by mouth.   MULTIVITAMIN PO Take 1  tablet by mouth daily.   naproxen sodium 220 MG tablet Commonly known as:  ALEVE Take 220 mg 2 (two) times daily as needed by mouth (for pain/headaches).   nebivolol 5 MG tablet Commonly known as:  BYSTOLIC Take 5 mg by mouth daily.   OPTI-CLEAR OP Place 1 drop into both eyes daily as needed (redness).   traMADol 50 MG tablet Commonly known as:  ULTRAM Take 1 tablet (50 mg total) by mouth every 6 (six) hours as needed for moderate pain or severe pain.   TURMERIC PO Take 1,000 mg by mouth at bedtime.   Vitamin D3 5000 units Caps Take 5,000 Units by mouth at bedtime.      Follow-up Information    Alphonsa Overall, MD Follow up.   Specialty:  General Surgery Contact information: [address] [place] [address] [phone]           Signed: Stark Klein 12/27/2017, 8:19 AM

## 2017-12-27 NOTE — Progress Notes (Signed)
Assessment unchanged. Pt verbalized understanding of dc instructions through teach back including when to call MD and follow up care. No scripts. Discharged via wc to front entrance accompanied by NT and husband.

## 2017-12-27 NOTE — Discharge Instructions (Signed)
[facility] - DISCHARGE INSTRUCTIONS TO PATIENT  Activity:  Driving - May drive in 3 or 4 days, if off pain meds and doing well   Lifting - No lifting more than 15 pounds for 3 more weeks  Wound Care:   May shower starting Saturday (after drain out)        Wear abdominal binder for one month  Diet:  As tolerated  Follow up appointment:  Call Dr. Pollie Friar office [facility]) at [phone] for an appointment in 2 to 3 weeks.        Will arrange for staple removal about 2 weeks from surgery  Medications and dosages:  Resume your home medications.  You have a prescription for:  Vicodin  Call Dr. Lucia Gaskins or his office  [phone]) if you have:  Temperature greater than 100.4,  Persistent nausea and vomiting,  Severe uncontrolled pain,  Redness, tenderness, or signs of infection (pain, swelling, redness, odor or green/yellow discharge around the site),  Difficulty breathing, headache or visual disturbances,  Any other questions or concerns you may have after discharge.  In an emergency, call 911 or go to an Emergency Department at a nearby hospital.

## 2017-12-29 ENCOUNTER — Other Ambulatory Visit: Payer: Self-pay | Admitting: *Deleted

## 2017-12-29 ENCOUNTER — Encounter: Payer: Self-pay | Admitting: *Deleted

## 2017-12-29 MED FILL — traMADol HCL 50 MG TABS: 50 | 5 days supply | Qty: 20 | Fill #0

## 2017-12-29 NOTE — Patient Outreach (Signed)
[facility]) Care Management  12/29/2017  Susan Montes May 28, 1951 [id number]   [name] via her home phone and transition of care assessment completed. Please see the transition of care template for details.  Susan Montes was hospitalized from 4/15-4/20 at [facility] for laparoscopic assisted ventral hernia repair with removal of prior mesh. She was discharged home on 4/20 and did not require home health services or durable medical equipment.  She did have an issue with getting her tramadol as she was discharged on Saturday and the prescription was called to the [facility] and they are not opened on weekends. She states she notify her nurse and was told the surgeon on call would be contacted and the prescription would be called to a retail pharmacy near her home but the pharmacy never received the prescription . [name] said she took her husband;d Tramadol until she was able to get her prescription form the pharmacy. At [facility] request , will follow up with [facility] outpatient pharmacy coordinator.  Will close case as no case management issues identified and with [name]'s permission will mail successful outreach letter with information on [place] case Management services should she need assistance in the future. Barrington Ellison RN,CCM,CDE [place] Management Coordinator Office Phone [phone] Office Fax [phone]

## 2017-12-31 MED FILL — JANUMET XR 50-1,000 MG TAB: 50-1000 | 30 days supply | Qty: 60 | Fill #2

## 2018-01-28 MED FILL — LEVOTHYROXINE 75 MCG TABLET: 75 | 90 days supply | Qty: 90 | Fill #1

## 2018-02-02 MED FILL — JANUMET XR 50-1,000 MG TAB: 50-1000 | 30 days supply | Qty: 60 | Fill #3

## 2018-02-06 DIAGNOSIS — L821 Other seborrheic keratosis: Secondary | ICD-10-CM | POA: Diagnosis not present

## 2018-02-06 DIAGNOSIS — D1801 Hemangioma of skin and subcutaneous tissue: Secondary | ICD-10-CM | POA: Diagnosis not present

## 2018-02-06 DIAGNOSIS — S40861A Insect bite (nonvenomous) of right upper arm, initial encounter: Secondary | ICD-10-CM | POA: Diagnosis not present

## 2018-02-06 DIAGNOSIS — L814 Other melanin hyperpigmentation: Secondary | ICD-10-CM | POA: Diagnosis not present

## 2018-02-06 DIAGNOSIS — D225 Melanocytic nevi of trunk: Secondary | ICD-10-CM | POA: Diagnosis not present

## 2018-02-06 DIAGNOSIS — D692 Other nonthrombocytopenic purpura: Secondary | ICD-10-CM | POA: Diagnosis not present

## 2018-02-06 DIAGNOSIS — Z85820 Personal history of malignant melanoma of skin: Secondary | ICD-10-CM | POA: Diagnosis not present

## 2018-02-06 MED FILL — TRIAMCINOLONE ACETONIDE 0.1: 0.1 | 25 days supply | Qty: 60 | Fill #0

## 2018-02-10 DIAGNOSIS — I10 Essential (primary) hypertension: Secondary | ICD-10-CM | POA: Diagnosis not present

## 2018-02-10 DIAGNOSIS — E039 Hypothyroidism, unspecified: Secondary | ICD-10-CM | POA: Diagnosis not present

## 2018-02-10 DIAGNOSIS — E118 Type 2 diabetes mellitus with unspecified complications: Secondary | ICD-10-CM | POA: Diagnosis not present

## 2018-02-10 MED FILL — BYSTOLIC 5 MG TABLET: 5 | 90 days supply | Qty: 90 | Fill #0

## 2018-03-02 MED FILL — JANUMET XR 50-1,000 MG TAB: 50-1000 | 30 days supply | Qty: 60 | Fill #4

## 2018-03-23 DIAGNOSIS — E119 Type 2 diabetes mellitus without complications: Secondary | ICD-10-CM | POA: Diagnosis not present

## 2018-03-23 DIAGNOSIS — H2513 Age-related nuclear cataract, bilateral: Secondary | ICD-10-CM | POA: Diagnosis not present

## 2018-03-30 MED FILL — JANUMET XR 50-1,000 MG TAB: 50-1000 | 30 days supply | Qty: 60 | Fill #5

## 2018-03-30 MED FILL — JARDIANCE 10 MG TABLET: 10 | 90 days supply | Qty: 90 | Fill #1

## 2018-04-27 MED FILL — LEVOTHYROXINE 75 MCG TABLET: 75 | 90 days supply | Qty: 90 | Fill #2

## 2018-05-04 MED FILL — BYSTOLIC 5 MG TABLET: 5 | 90 days supply | Qty: 90 | Fill #1

## 2018-05-04 MED FILL — JANUMET XR 50-1,000 MG TAB: 50-1000 | 30 days supply | Qty: 60 | Fill #6

## 2018-05-05 DIAGNOSIS — Z822 Family history of deafness and hearing loss: Secondary | ICD-10-CM | POA: Diagnosis not present

## 2018-05-05 DIAGNOSIS — H903 Sensorineural hearing loss, bilateral: Secondary | ICD-10-CM | POA: Diagnosis not present

## 2018-06-04 DIAGNOSIS — E039 Hypothyroidism, unspecified: Secondary | ICD-10-CM | POA: Diagnosis not present

## 2018-06-04 DIAGNOSIS — E119 Type 2 diabetes mellitus without complications: Secondary | ICD-10-CM | POA: Diagnosis not present

## 2018-06-04 DIAGNOSIS — E7800 Pure hypercholesterolemia, unspecified: Secondary | ICD-10-CM | POA: Diagnosis not present

## 2018-06-04 DIAGNOSIS — I10 Essential (primary) hypertension: Secondary | ICD-10-CM | POA: Diagnosis not present

## 2018-06-04 MED FILL — JANUMET XR 50-1,000 MG TAB: 50-1000 | 30 days supply | Qty: 60 | Fill #7

## 2018-06-11 DIAGNOSIS — R9431 Abnormal electrocardiogram [ECG] [EKG]: Secondary | ICD-10-CM | POA: Diagnosis not present

## 2018-06-11 DIAGNOSIS — H9319 Tinnitus, unspecified ear: Secondary | ICD-10-CM | POA: Diagnosis not present

## 2018-06-11 DIAGNOSIS — Z0000 Encounter for general adult medical examination without abnormal findings: Secondary | ICD-10-CM | POA: Diagnosis not present

## 2018-06-11 DIAGNOSIS — E119 Type 2 diabetes mellitus without complications: Secondary | ICD-10-CM | POA: Diagnosis not present

## 2018-06-11 DIAGNOSIS — I10 Essential (primary) hypertension: Secondary | ICD-10-CM | POA: Diagnosis not present

## 2018-06-11 DIAGNOSIS — E039 Hypothyroidism, unspecified: Secondary | ICD-10-CM | POA: Diagnosis not present

## 2018-06-11 DIAGNOSIS — Z780 Asymptomatic menopausal state: Secondary | ICD-10-CM | POA: Diagnosis not present

## 2018-06-11 DIAGNOSIS — E7800 Pure hypercholesterolemia, unspecified: Secondary | ICD-10-CM | POA: Diagnosis not present

## 2018-06-15 DIAGNOSIS — E039 Hypothyroidism, unspecified: Secondary | ICD-10-CM | POA: Diagnosis not present

## 2018-06-15 DIAGNOSIS — E118 Type 2 diabetes mellitus with unspecified complications: Secondary | ICD-10-CM | POA: Diagnosis not present

## 2018-06-15 DIAGNOSIS — Z6831 Body mass index (BMI) 31.0-31.9, adult: Secondary | ICD-10-CM | POA: Diagnosis not present

## 2018-06-15 DIAGNOSIS — E7800 Pure hypercholesterolemia, unspecified: Secondary | ICD-10-CM | POA: Diagnosis not present

## 2018-06-15 DIAGNOSIS — I10 Essential (primary) hypertension: Secondary | ICD-10-CM | POA: Diagnosis not present

## 2018-06-15 MED FILL — METFORMIN HCL ER 500 MG TAB: 500 | 30 days supply | Qty: 120 | Fill #0

## 2018-06-15 MED FILL — FREESTYLE LITE TEST STRIP: 90 days supply | Qty: 100 | Fill #0

## 2018-06-15 MED FILL — FREESTYLE LITE METER: 1 days supply | Qty: 1 | Fill #0

## 2018-06-15 MED FILL — FREESTYLE LANCETS: 90 days supply | Qty: 100 | Fill #0

## 2018-06-16 DIAGNOSIS — H903 Sensorineural hearing loss, bilateral: Secondary | ICD-10-CM | POA: Diagnosis not present

## 2018-06-18 DIAGNOSIS — R9431 Abnormal electrocardiogram [ECG] [EKG]: Secondary | ICD-10-CM | POA: Diagnosis not present

## 2018-06-18 MED FILL — TRULICITY 0.75 MG/0.5 ML PE: 0.75 | 28 days supply | Qty: 2 | Fill #0

## 2018-07-01 ENCOUNTER — Ambulatory Visit: Payer: 59

## 2018-07-01 ENCOUNTER — Other Ambulatory Visit: Payer: Self-pay | Admitting: Occupational Medicine

## 2018-07-01 DIAGNOSIS — M79671 Pain in right foot: Secondary | ICD-10-CM

## 2018-07-10 MED FILL — metFORMIN HCL ER 500 MG TB2: 500 | 30 days supply | Qty: 120 | Fill #1

## 2018-07-23 MED FILL — TRULICITY 1.5 MG/0.5 ML PEN: 1.5 | 84 days supply | Qty: 6 | Fill #0

## 2018-07-31 MED FILL — LEVOTHYROXINE 75 MCG TABLET: 75 | 90 days supply | Qty: 90 | Fill #3

## 2018-07-31 MED FILL — BYSTOLIC 5 MG TABLET: 5 | 90 days supply | Qty: 90 | Fill #0

## 2018-08-10 ENCOUNTER — Other Ambulatory Visit: Payer: Self-pay | Admitting: Internal Medicine

## 2018-08-10 DIAGNOSIS — Z85820 Personal history of malignant melanoma of skin: Secondary | ICD-10-CM | POA: Diagnosis not present

## 2018-08-10 DIAGNOSIS — D692 Other nonthrombocytopenic purpura: Secondary | ICD-10-CM | POA: Diagnosis not present

## 2018-08-10 DIAGNOSIS — D1801 Hemangioma of skin and subcutaneous tissue: Secondary | ICD-10-CM | POA: Diagnosis not present

## 2018-08-10 DIAGNOSIS — Z1231 Encounter for screening mammogram for malignant neoplasm of breast: Secondary | ICD-10-CM

## 2018-08-10 DIAGNOSIS — L430 Hypertrophic lichen planus: Secondary | ICD-10-CM | POA: Diagnosis not present

## 2018-08-10 DIAGNOSIS — D2261 Melanocytic nevi of right upper limb, including shoulder: Secondary | ICD-10-CM | POA: Diagnosis not present

## 2018-08-10 DIAGNOSIS — L821 Other seborrheic keratosis: Secondary | ICD-10-CM | POA: Diagnosis not present

## 2018-08-10 DIAGNOSIS — L814 Other melanin hyperpigmentation: Secondary | ICD-10-CM | POA: Diagnosis not present

## 2018-08-10 DIAGNOSIS — D485 Neoplasm of uncertain behavior of skin: Secondary | ICD-10-CM | POA: Diagnosis not present

## 2018-08-10 MED FILL — metFORMIN HCL ER 500 MG TB2: 500 | 30 days supply | Qty: 120 | Fill #2

## 2018-09-10 DIAGNOSIS — E039 Hypothyroidism, unspecified: Secondary | ICD-10-CM | POA: Diagnosis not present

## 2018-09-10 DIAGNOSIS — E118 Type 2 diabetes mellitus with unspecified complications: Secondary | ICD-10-CM | POA: Diagnosis not present

## 2018-09-10 MED FILL — metFORMIN HCL ER 500 MG TB2: 500 | 90 days supply | Qty: 360 | Fill #3

## 2018-09-16 DIAGNOSIS — E039 Hypothyroidism, unspecified: Secondary | ICD-10-CM | POA: Diagnosis not present

## 2018-09-16 DIAGNOSIS — I10 Essential (primary) hypertension: Secondary | ICD-10-CM | POA: Diagnosis not present

## 2018-09-16 DIAGNOSIS — E118 Type 2 diabetes mellitus with unspecified complications: Secondary | ICD-10-CM | POA: Diagnosis not present

## 2018-09-16 DIAGNOSIS — Z6831 Body mass index (BMI) 31.0-31.9, adult: Secondary | ICD-10-CM | POA: Diagnosis not present

## 2018-09-21 ENCOUNTER — Ambulatory Visit
Admission: RE | Admit: 2018-09-21 | Discharge: 2018-09-21 | Disposition: A | Discharge status: Home / Self Care | Payer: 59 | Source: Ambulatory Visit | Attending: Internal Medicine | Admitting: Internal Medicine

## 2018-09-21 DIAGNOSIS — Z1231 Encounter for screening mammogram for malignant neoplasm of breast: Secondary | ICD-10-CM

## 2018-09-23 ENCOUNTER — Other Ambulatory Visit: Payer: Self-pay | Admitting: Internal Medicine

## 2018-09-23 DIAGNOSIS — R928 Other abnormal and inconclusive findings on diagnostic imaging of breast: Secondary | ICD-10-CM

## 2018-09-28 ENCOUNTER — Ambulatory Visit: Payer: 59

## 2018-09-28 ENCOUNTER — Ambulatory Visit
Admission: RE | Admit: 2018-09-28 | Discharge: 2018-09-28 | Disposition: A | Discharge status: Home / Self Care | Payer: 59 | Source: Ambulatory Visit | Attending: Internal Medicine | Admitting: Internal Medicine

## 2018-09-28 DIAGNOSIS — R922 Inconclusive mammogram: Secondary | ICD-10-CM | POA: Diagnosis not present

## 2018-09-28 DIAGNOSIS — R928 Other abnormal and inconclusive findings on diagnostic imaging of breast: Secondary | ICD-10-CM

## 2018-09-30 MED FILL — TRULICITY 1.5 MG/0.5 ML PEN: 1.5 | 84 days supply | Qty: 6 | Fill #1

## 2019-01-06 ENCOUNTER — Other Ambulatory Visit: Payer: Self-pay | Admitting: *Deleted

## 2019-01-06 ENCOUNTER — Other Ambulatory Visit: Payer: Self-pay

## 2019-01-06 NOTE — Patient Outreach (Addendum)
[facility]) Care Management  01/06/2019  Susan Montes 10-20-1950 [id number]   [place] has sent a welcome letter . As a Benefit of this Health Care Plan. RN will follow up outreach for disease management within the month of May.

## 2019-01-07 DIAGNOSIS — I10 Essential (primary) hypertension: Secondary | ICD-10-CM | POA: Diagnosis not present

## 2019-01-07 DIAGNOSIS — E669 Obesity, unspecified: Secondary | ICD-10-CM | POA: Diagnosis not present

## 2019-01-07 DIAGNOSIS — K439 Ventral hernia without obstruction or gangrene: Secondary | ICD-10-CM | POA: Diagnosis not present

## 2019-01-07 DIAGNOSIS — E1169 Type 2 diabetes mellitus with other specified complication: Secondary | ICD-10-CM | POA: Diagnosis not present

## 2019-01-19 DIAGNOSIS — D485 Neoplasm of uncertain behavior of skin: Secondary | ICD-10-CM | POA: Diagnosis not present

## 2019-01-19 DIAGNOSIS — D2272 Melanocytic nevi of left lower limb, including hip: Secondary | ICD-10-CM | POA: Diagnosis not present

## 2019-02-03 ENCOUNTER — Other Ambulatory Visit: Payer: Self-pay | Admitting: *Deleted

## 2019-02-03 NOTE — Patient Outreach (Signed)
[facility]) Care Management  02/03/2019  Susan Montes 1951-01-25 [id number]   RN Health Coach attempted #1 follow up outreach call to patient.  Patient was unavailable. HIPPA compliance voicemail message left with return callback number.  Plan: RN will call patient again within 30 days.  [place] Care Management [phone]

## 2019-02-04 ENCOUNTER — Other Ambulatory Visit: Payer: Self-pay | Admitting: *Deleted

## 2019-02-04 NOTE — Patient Outreach (Signed)
[facility]) Care Management  02/04/2019  Susan Montes [birth year] [id number]   [place] received return telephone call from patient.  Hipaa compliance verified. RN Health Coach discussed with the patient about the services available. Per patient her diabetes and hypertension  is under control. She is following  closely with her Endocrinologist. Per patient she is following up with all her appointments. Patient stated that she feels this is redundant and it is one more call that she has to answer. Patient requested that she not be called because she is very active and mobile. She can afford her medications. She does appreciate that this service is available.   Plan: Send out unsuccessful outreach letter Patient requested no further phone calls  [place] Management [phone]

## 2019-02-05 DIAGNOSIS — E039 Hypothyroidism, unspecified: Secondary | ICD-10-CM | POA: Diagnosis not present

## 2019-02-05 DIAGNOSIS — I10 Essential (primary) hypertension: Secondary | ICD-10-CM | POA: Diagnosis not present

## 2019-02-05 DIAGNOSIS — E118 Type 2 diabetes mellitus with unspecified complications: Secondary | ICD-10-CM | POA: Diagnosis not present

## 2019-02-12 DIAGNOSIS — E039 Hypothyroidism, unspecified: Secondary | ICD-10-CM | POA: Diagnosis not present

## 2019-02-12 DIAGNOSIS — I10 Essential (primary) hypertension: Secondary | ICD-10-CM | POA: Diagnosis not present

## 2019-02-12 DIAGNOSIS — E119 Type 2 diabetes mellitus without complications: Secondary | ICD-10-CM | POA: Diagnosis not present

## 2019-03-18 ENCOUNTER — Telehealth: Payer: Self-pay | Admitting: Pharmacist

## 2019-03-18 NOTE — Patient Outreach (Signed)
[facility]) Care Management  03/18/2019  Susan Montes Friar 1951/01/22 [id number]  Patient was called per HTA referral for medication assistance with Trulicity. Unfortunately, she did not answer the phone. HIPAA compliant message was left on her home phone.  The mobile number did not have a voicemail set up.  Plan: Send patient an unsuccessful contact letter. Call patient back in 5-7 business days.   Elayne Guerin, PharmD, [place] Clinical Pharmacist [phone]

## 2019-03-23 ENCOUNTER — Other Ambulatory Visit: Payer: Self-pay | Admitting: Pharmacy Technician

## 2019-03-23 ENCOUNTER — Other Ambulatory Visit: Payer: Self-pay | Admitting: Pharmacist

## 2019-03-23 NOTE — Patient Outreach (Addendum)
[place] [facility]) Care Management  [place]   03/23/2019  Sheriden Archibeque Hladik 10/12/1950 [id number]  Reason for referral: medication assistance  Referral source: HTA Referral medication(s): Trulicity Current insurance:HTA  HPI:  Patient is a 68 year old female with multiple medical conditions including but not limited to:  Type 2 diabetes,and  osteoarthritis of right knee.  Objective: Allergies  Allergen Reactions  . Anesthetics, Amide Nausea And Vomiting  . Statins     Myalgia, Headache    Medications Reviewed Today    Reviewed by Elayne Guerin, [facility] (Pharmacist) on 03/23/19 at 0850  Med List Status: <None>  Medication Order Taking? Sig Documenting Provider Last Dose Status Informant  acetaminophen (TYLENOL) 500 MG tablet [id number] Yes Take 1,000 mg every 6 (six) hours as needed by mouth for moderate pain or headache. [provider] Taking Active Self  Aspirin-Caffeine (BC FAST PAIN RELIEF PO) [id number] Yes Take 1 packet by mouth daily as needed (pain). [provider] Taking Active Self  calcium carbonate (TUMS - DOSED IN MG ELEMENTAL CALCIUM) 500 MG chewable tablet [id number] Yes Chew 1-2 tablets by mouth daily as needed for indigestion or heartburn. [provider] Taking Active Self  Cholecalciferol (VITAMIN D3) 5000 units CAPS [id number] Yes Take 5,000 Units by mouth at bedtime.  [provider] Taking Active Self  clobetasol (TEMOVATE) 0.05 % external solution [id number] Yes Apply 1 application daily as needed topically (for scalp). [provider] Taking Active Self        Discontinued 03/23/19 [phone] (Patient Preference)   ibuprofen (ADVIL,MOTRIN) 200 MG tablet [id number] Yes Take 400 mg every 6 (six) hours as needed by mouth for headache or moderate pain.  [provider] Taking Active Self  levothyroxine (SYNTHROID, LEVOTHROID) 75 MCG tablet [id number] Yes Take 75 mcg daily before breakfast by mouth.  [provider] Taking Active Self  metFORMIN (GLUCOPHAGE-XR) 500 MG 24 hr tablet [id number] Yes Take 2 tablets by mouth 2 (two) times a day. [provider] Taking Active   Multiple Vitamin (MULTIVITAMIN PO) [id number] Yes Take 1 tablet by mouth daily.   [provider] Taking Active Self  naproxen sodium (ALEVE) 220 MG tablet [id number] Yes Take 220 mg 2 (two) times daily as needed by mouth (for pain/headaches). [provider] Taking Active Self  nebivolol (BYSTOLIC) 5 MG tablet [id number] Yes Take 5 mg by mouth daily. [provider] Taking Active Self  Tetrahydrozoline HCl (OPTI-CLEAR OP) [id number] Yes Place 1 drop into both eyes daily as needed (redness). [provider] Taking Active Self  traMADol (ULTRAM) 50 MG tablet [id number] Yes Take 1 tablet (50 mg total) by mouth every 6 (six) hours as needed for moderate pain or severe pain. Stark Klein, MD Taking Active   TRULICITY 1.5 QQ/2.2LN [place] Aid [id number] Yes Use 1 injection weekly [provider] Taking Active   TURMERIC PO [id number] Yes Take 1,000 mg by mouth at bedtime. [provider] Taking Active Self          Assessment:  Drugs sorted by system:  Cardiovascular: Bystolic  Endocrine: Levothyroxine, Metformin  Topical: Clobetasol  Pain: Acetaminophen, BC Fast Pain Relief, Ibuprofen, Naproxen, Tramadol,   Vitamins/Minerals/Supplements: Calcium Carbonate, Cholecalciferol, Turmeric  Miscellaneous: Opticlear  Medication Review Findings:  . Patient reported her HgA1c is 5.7% KPN recorded 5.9% with the highest reading of 6% in the last three years. o She has an appointment with her provider in August and said she would like to speak with them  about Trulicity because  . She is not on a statin. She has a statin allergy and said even tried taking a statin weekly/three times weekly without success. . Patient reported she does not want to continue Trulicity  because it causes her to be nauseated. Discussed options to decrease nausea with the patient but she still said she did not want to continue it as it was too expensive and her HgA1c was 6% before starting it and it dropped to 5.7%.   Medication Assistance Findings:  Medication assistance needs identified: Journalist, newspaper and Trulicity  Patient may qualify for [employer] Program for [employer] and [employer] for [employer].  Patient will be sent a Bystolic application. She requested to not be sent a Trulicity application because she wants to speak with her provider about not continuing to take it.  She is over income for LIS/Extra Help.   Additional medication assistance options reviewed with patient as warranted:  No other options identified  Plan: I will route patient assistance letter to [place] technician who will coordinate patient assistance program application process for medications listed above.  [facility] pharmacy technician will assist with obtaining all required documents from both patient and provider(s) and submit application(s) once completed.    Follow up with the patient in 3-4 weeks after her provider appointment.  Elayne Guerin, PharmD, [place] Clinical Pharmacist [phone]

## 2019-03-23 NOTE — Patient Outreach (Signed)
[place] [facility]) Care Management  03/23/2019  Jendaya Gossett Wolfson 13-Jul-1951 [id number]                           Medication Assistance Referral  Referral From: [place]  Medication/Company: Bystolic / [employer] Patient application portion:  Mailed Provider application portion: Faxed  to Dr Jani Gravel    Follow up:  Will follow up with patient in 5-10 business days to confirm application(s) have been received.  Danuta Huseman P. Jaspreet Bodner, [place] Management [phone]

## 2019-03-31 ENCOUNTER — Other Ambulatory Visit: Payer: Self-pay | Admitting: Pharmacy Technician

## 2019-03-31 NOTE — Patient Outreach (Signed)
[place]) Care Management  03/31/2019  Arvella Massingale Payer 1951-08-12 [id number]   Successful outreach call placed to patient in regards to [place] patient assistance application for Bystolic.  Spoke to patient, HIPAA identifiers verified.  Patient informed she had received the application but she had some questions about the process. Discussed patient's questions and went over the process with the patient. Patient informed she would have the application in the mail hopefully today but if not then by the end of the week.  Will followup with patient in 10-14 business days if application has not been received.  Treyshaun Keatts P. Klay Sobotka, [place] Management [phone]

## 2019-04-02 DIAGNOSIS — Z1211 Encounter for screening for malignant neoplasm of colon: Secondary | ICD-10-CM | POA: Diagnosis not present

## 2019-04-06 ENCOUNTER — Other Ambulatory Visit: Payer: Self-pay | Admitting: Pharmacy Technician

## 2019-04-06 NOTE — Patient Outreach (Signed)
[facility] [facility]) Care Management  04/06/2019  Susan Montes 07/21/1951 [id number]    Received all necessary documents and signatures from both patient and provider for [place] patient assistance for Bystolic.  Submitted completed application via fax.  Will followup with [employer] in 10-14 business days.  Arthi Mcdonald P. Celia Gibbons, [place] Management [phone]

## 2019-04-09 ENCOUNTER — Other Ambulatory Visit: Payer: Self-pay

## 2019-04-12 DIAGNOSIS — E039 Hypothyroidism, unspecified: Secondary | ICD-10-CM | POA: Diagnosis not present

## 2019-04-12 DIAGNOSIS — E119 Type 2 diabetes mellitus without complications: Secondary | ICD-10-CM | POA: Diagnosis not present

## 2019-04-19 DIAGNOSIS — B379 Candidiasis, unspecified: Secondary | ICD-10-CM | POA: Diagnosis not present

## 2019-04-19 DIAGNOSIS — Z6832 Body mass index (BMI) 32.0-32.9, adult: Secondary | ICD-10-CM | POA: Diagnosis not present

## 2019-04-19 DIAGNOSIS — E039 Hypothyroidism, unspecified: Secondary | ICD-10-CM | POA: Diagnosis not present

## 2019-04-19 DIAGNOSIS — I10 Essential (primary) hypertension: Secondary | ICD-10-CM | POA: Diagnosis not present

## 2019-04-19 DIAGNOSIS — E118 Type 2 diabetes mellitus with unspecified complications: Secondary | ICD-10-CM | POA: Diagnosis not present

## 2019-04-19 MED FILL — FLUCONAZOLE 100 MG TABLET: 100 | 3 days supply | Qty: 3 | Fill #0

## 2019-04-20 ENCOUNTER — Ambulatory Visit: Payer: Self-pay | Admitting: Pharmacist

## 2019-04-21 ENCOUNTER — Other Ambulatory Visit: Payer: Self-pay | Admitting: Pharmacy Technician

## 2019-04-21 NOTE — Patient Outreach (Signed)
[place] [facility]) Care Management  04/21/2019  Susan Montes 20-May-1951 [id number]   Care coordination call placed to [place] in regards to patient's application for Bystolic.  Spoke to [name] who informed the application had been received and not processed. She informed they were running a bit behind on processing applications.  Will followup with [employer] in 5-7 business days to inquire on status of application.  [name] [name], [name] Management [phone]

## 2019-04-28 ENCOUNTER — Other Ambulatory Visit: Payer: Self-pay | Admitting: Pharmacy Technician

## 2019-04-28 NOTE — Patient Outreach (Signed)
[facility]) Care Management  04/28/2019  Susan Montes Below [birth year] [id number]  Care coordination call placed to [place] in regards to patient's application for Bystolic.  Spoke to [place] who informed patient was DENIED for the program on 04/21/2019. She informed an application is denied for 1 of 2 reasons which include #1  income based on household size or #2 cost of medication. In this case patient denied  due to not meeting the OOP guidelines for the medication. [name] informed they "don't publish those guidelines due to if they did then that would be the amount that would be written on the applications." Therefore they keep they information confidential from the public and request that they fill in the blank for how much the medication costs and then [employer] makes the determination by what is written on the application. [name] informed patient's can appeal the decision if they so desire.  In this case she informed patient was sent a letter detailing an appeals process which she informed does not have a time frame for approval. She informed patient could appeal herself by following the guidelines stated in the letter.  Will route note to [place] that patient assistance is completed due to patient being denied and will remove myself from care team.  Susan Montes. Susan Montes, [name] Management [phone]

## 2019-04-29 ENCOUNTER — Encounter: Payer: Self-pay | Admitting: Pharmacy Technician

## 2019-04-29 ENCOUNTER — Other Ambulatory Visit: Payer: Self-pay | Admitting: Pharmacy Technician

## 2019-04-29 NOTE — Patient Outreach (Signed)
[place] [facility]) Care Management  04/29/2019  Susan Montes 05/08/1951 [id number]    Incoming call received from patient in regards to [place] application for Bystolic.  Spoke to patient, HIPAA identifiers verified.  Informed patient that she had been denied through [place] for patient assistance due to not meeting the OOP guidelines set form by [employer] who refuse to disclose what those guidelines are. Informed patient she would be getting a denial letter in the mail with a detail explanation of how she could appeal the decision if she so desired. Patient inquired if there were other options and informed patient other than paying the price set forth by her prescription drug plan I did not know of any other options unless she wanted to appeal the decision herself as set forth in the letter she is to receive from [place].  Patient informed that she decided to stay on the Trulicity. She inquired if we could mail her the [employer] application. Informed patient I would mail her a [place] manager and fax the provider part to Dr. Garnet Koyanagi. Infromed patient the application would go out in the mail on 04/30/2019 and that I would followup with her in 5-10 business days to niquire if she has received it. Patient was agreeable to this plan.  Susan Montes P. Susan Montes, [place] Management [phone]

## 2019-05-11 DIAGNOSIS — Z85820 Personal history of malignant melanoma of skin: Secondary | ICD-10-CM | POA: Diagnosis not present

## 2019-05-11 DIAGNOSIS — L814 Other melanin hyperpigmentation: Secondary | ICD-10-CM | POA: Diagnosis not present

## 2019-05-11 DIAGNOSIS — D692 Other nonthrombocytopenic purpura: Secondary | ICD-10-CM | POA: Diagnosis not present

## 2019-05-11 DIAGNOSIS — L821 Other seborrheic keratosis: Secondary | ICD-10-CM | POA: Diagnosis not present

## 2019-05-12 ENCOUNTER — Other Ambulatory Visit: Payer: Self-pay | Admitting: Pharmacy Technician

## 2019-05-12 NOTE — Patient Outreach (Signed)
[facility] [facility]) Care Management  05/12/2019  Susan Montes 1951-05-16 [id number]  Unsuccessful outreach call placed to patient in regards to [place] application for Trulicity. This was the 1st call attempt.  Unfortunately patient did not answer the phone, HIPAA compliant voicemail was left.  Was calling patient to inquire if she had received the Lilly application that was mailed out on 04/29/2019.  Will followup with patient in 5-7 business days if call is not returned.  Seniyah Esker P. Yahia Bottger, [place] Management [phone]

## 2019-05-13 ENCOUNTER — Other Ambulatory Visit: Payer: Self-pay | Admitting: Pharmacy Technician

## 2019-05-13 NOTE — Patient Outreach (Signed)
[place] [facility]) Care Management  05/13/2019  Susan Montes 11-18-1950 [id number]    Incoming call received from patient who was returning my call.  Spoke to patient, HIPAA identifiers verified.  Informed patient I had received her portion of the application but had not been able to receive back the provider's part of the application. Informed patient provider's office had been faxed twice. Confirmed correct provider was being faxed. Patient informed she would reach out to the provider's office.  Will await return fax from provider's office and then will submit entire application.  Nickolas Chalfin P. Mikkel Charrette, [place] Management [phone]

## 2019-05-20 ENCOUNTER — Other Ambulatory Visit: Payer: Self-pay | Admitting: Pharmacy Technician

## 2019-05-20 NOTE — Patient Outreach (Signed)
[place] [facility]) Care Management  05/20/2019  Susan Montes [birth year] [id number]    Received all necessary documents and signatures from both patient and provider for [employer] patient assistance for Trulicity.  Submitted completed application to [employer] via fax.  Will followup with Lilly in 5-7 business days to inquire on status of application.  Susan Montes, [place] Management [phone]

## 2019-05-27 ENCOUNTER — Other Ambulatory Visit: Payer: Self-pay | Admitting: Pharmacy Technician

## 2019-05-27 NOTE — Patient Outreach (Signed)
[place]) Care Management  05/27/2019  Brittine Fearnow [place] 1950-11-14 [id number]    ADDENDUM  Unsuccessful outreach call placed to patient in regards to [place] application for Trulicity.  Unfortunately patient did not answer the phone, HIPAA compliant voicemail left.  Was calling patient to inform that Trulicity would be delivered on 06/04/2019.  Will followup with patient in 5-7 business days to confirm receipt of medication.  Helina Hullum P. Linzie Criss, [place] Management [phone]

## 2019-05-27 NOTE — Patient Outreach (Signed)
[place] [facility]) Care Management  05/27/2019  Susan Montes 25-Sep-1950 [id number]    Care coordination call placed to [place] and [place] in regards to patient's Trulicity application.  Spoke to [place] at [facility] who informed patient APPROVED 05/21/2019-09/09/2019. [place] informed that the pharmacy has between 10-30 business days to process and setup delivery of the medication.  Spoke to [place] at [facility] who informed the medication would be shipped to the patient's home on 06/04/2019. Patient will need to sign for the medication.  Will followup with patient concerning above information.  Kelli Robeck P. Semya Klinke, [place] Management [phone]

## 2019-05-27 NOTE — Patient Outreach (Signed)
[facility] [facility]) Care Management  05/27/2019  Susan Montes Jan 19, 1951 [id number]    ADDENDUM  Incoming call received from patient, HIPAA identifiers verified.  Patient was returning my call in regards to [place] application for Trulicity.  Informed patient that she was approved with the [place] Patient assistance foundation to receive her medication at no charge until 09/09/2019. Informed patient her medication would arrive on 06/04/2019.  Patient informed she would be out of town on that day. Provided patient the phone number to [facility] to call and reschedule her shipment.  Will followup with patient in 5-10 business days to inquire if medication was received.  Geramy Lamorte P. Sedric Guia, [place] Management [phone]

## 2019-06-01 ENCOUNTER — Ambulatory Visit: Payer: Self-pay | Admitting: Pharmacist

## 2019-06-04 ENCOUNTER — Other Ambulatory Visit: Payer: Self-pay | Admitting: Pharmacy Technician

## 2019-06-04 NOTE — Patient Outreach (Signed)
[place] [facility]) Care Management  06/04/2019  Susan Montes Mar 16, 1951 [id number]    Care coordination call placed to [name] in regards to patient's Lilly application for Trulicity.  Spoke to [place] who informed the medication is due to be delivered 06/08/2019.  Will followup with patient as previously scheduled.  Oswin Griffith P. Cionna Collantes, [place] Management [phone]

## 2019-06-08 ENCOUNTER — Other Ambulatory Visit: Payer: Self-pay | Admitting: Pharmacy Technician

## 2019-06-08 NOTE — Patient Outreach (Signed)
[facility] [facility]) Care Management  06/08/2019  Susan Montes 1951-05-18 [id number]   Successful outreach call placed to patient in regards to [place] application for Trulicity.  Spoke to patient, HIPAA identifiers verified.  Informed patient that Karie Kirks at [facility] said her medication could still be delivered today but if it was not then it would be delivered by week's end. Patient informed she hopes it gets there this week because she will be out of town next week.  Will followup with patient in 5-7 business days to inquire if medication was received.  Susan Montes, [place] Management [phone]

## 2019-06-08 NOTE — Patient Outreach (Signed)
[facility]) Care Management  06/08/2019  Brean Calbert Arismendez Dec 17, 1950 [id number]  Care coordination call placed to [place] in regards to patient's Trulicity application with [employer].  Spoke to [place] who informed that the medication may still be delivered today but she informed her supervisor said it would be delivered this week if not today.  Will followup with patient.  Aadith Raudenbush P. Kacie Huxtable, [place] Management [phone]

## 2019-06-14 ENCOUNTER — Other Ambulatory Visit: Payer: Self-pay | Admitting: Pharmacy Technician

## 2019-06-14 NOTE — Patient Outreach (Signed)
[name] [name] Care Management  06/14/2019  Susan Montes [place] Mar 25, 1951 [id number]   Successful outgoing call placed to patient in regards to [place] application for Trulicity.  Spoke to patient, HIPAA identifiers verified.  Was returning patient's voicemail from Friday. Patient informed she had called to inform me that she received her Trulicity. Patient informed she received 3 boxes of Trulicity and each box contained 4 pens. Discussed refill procedure with patient. Informed patient she was setup for automatic refills thru 09/09/2019. However, if she was down to a 2-3 weeks supply of medication and had not heard from the company, then she would need to reach out to them by calling [employer]. Informed patient the phone number was on the pharmacy label located on the boxes of medication. Patient verbalized understating. Patient denied having any other questions or concerns at this time. Confirmed patient had name and number for future needs.  Will route note to [place] that patient assistance has been completed and will remove myself from care team.  Luiz Ochoa. [name], [place] Management [phone]

## 2019-06-18 ENCOUNTER — Telehealth: Payer: Self-pay | Admitting: Pharmacist

## 2019-06-18 NOTE — Patient Outreach (Signed)
[place] [facility]) Care Management  06/18/2019  Susan Montes [birth year] [id number]   Patient's case is being closed because she received Trulicity from [place]. She communicated understanding to [employer], CPhT about how to reorder from [employer].   Patient's most recent HgA1c was 5.9%.  Plan: Close patient's case. She will gladly be reopened at request or need.  Elayne Guerin, PharmD, [place] Clinical Pharmacist [phone]

## 2019-07-07 ENCOUNTER — Ambulatory Visit: Payer: Self-pay | Admitting: Pharmacist

## 2019-07-15 DIAGNOSIS — E1169 Type 2 diabetes mellitus with other specified complication: Secondary | ICD-10-CM | POA: Diagnosis not present

## 2019-07-15 DIAGNOSIS — I10 Essential (primary) hypertension: Secondary | ICD-10-CM | POA: Diagnosis not present

## 2019-07-15 DIAGNOSIS — K439 Ventral hernia without obstruction or gangrene: Secondary | ICD-10-CM | POA: Diagnosis not present

## 2019-07-15 DIAGNOSIS — E669 Obesity, unspecified: Secondary | ICD-10-CM | POA: Diagnosis not present

## 2019-07-16 DIAGNOSIS — Z135 Encounter for screening for eye and ear disorders: Secondary | ICD-10-CM | POA: Diagnosis not present

## 2019-07-16 DIAGNOSIS — H5203 Hypermetropia, bilateral: Secondary | ICD-10-CM | POA: Diagnosis not present

## 2019-07-16 DIAGNOSIS — H524 Presbyopia: Secondary | ICD-10-CM | POA: Diagnosis not present

## 2019-07-16 DIAGNOSIS — H52223 Regular astigmatism, bilateral: Secondary | ICD-10-CM | POA: Diagnosis not present

## 2019-07-21 ENCOUNTER — Other Ambulatory Visit: Payer: Self-pay | Admitting: Surgery

## 2019-07-21 DIAGNOSIS — K439 Ventral hernia without obstruction or gangrene: Secondary | ICD-10-CM

## 2019-07-30 ENCOUNTER — Other Ambulatory Visit: Payer: Self-pay

## 2019-07-30 ENCOUNTER — Ambulatory Visit
Admission: RE | Admit: 2019-07-30 | Discharge: 2019-07-30 | Disposition: A | Discharge status: Home / Self Care | Payer: PPO | Source: Ambulatory Visit | Attending: Surgery | Admitting: Surgery

## 2019-07-30 DIAGNOSIS — K439 Ventral hernia without obstruction or gangrene: Secondary | ICD-10-CM

## 2019-07-30 DIAGNOSIS — K760 Fatty (change of) liver, not elsewhere classified: Secondary | ICD-10-CM | POA: Diagnosis not present

## 2019-07-30 DIAGNOSIS — K5730 Diverticulosis of large intestine without perforation or abscess without bleeding: Secondary | ICD-10-CM | POA: Diagnosis not present

## 2019-07-30 MED ORDER — IOPAMIDOL (ISOVUE-300) INJECTION 61%
100.0000 mL | Freq: Once | INTRAVENOUS | Status: AC | PRN
  Administered 2019-07-30: 100 mL via INTRAVENOUS

## 2019-08-09 DIAGNOSIS — E039 Hypothyroidism, unspecified: Secondary | ICD-10-CM | POA: Diagnosis not present

## 2019-08-09 DIAGNOSIS — E7800 Pure hypercholesterolemia, unspecified: Secondary | ICD-10-CM | POA: Diagnosis not present

## 2019-08-09 DIAGNOSIS — I10 Essential (primary) hypertension: Secondary | ICD-10-CM | POA: Diagnosis not present

## 2019-08-09 DIAGNOSIS — E119 Type 2 diabetes mellitus without complications: Secondary | ICD-10-CM | POA: Diagnosis not present

## 2019-08-09 DIAGNOSIS — Z0000 Encounter for general adult medical examination without abnormal findings: Secondary | ICD-10-CM | POA: Diagnosis not present

## 2019-08-12 DIAGNOSIS — E7800 Pure hypercholesterolemia, unspecified: Secondary | ICD-10-CM | POA: Diagnosis not present

## 2019-08-12 DIAGNOSIS — Z85820 Personal history of malignant melanoma of skin: Secondary | ICD-10-CM | POA: Diagnosis not present

## 2019-08-12 DIAGNOSIS — B373 Candidiasis of vulva and vagina: Secondary | ICD-10-CM | POA: Diagnosis not present

## 2019-08-12 DIAGNOSIS — Z0000 Encounter for general adult medical examination without abnormal findings: Secondary | ICD-10-CM | POA: Diagnosis not present

## 2019-08-12 DIAGNOSIS — E119 Type 2 diabetes mellitus without complications: Secondary | ICD-10-CM | POA: Diagnosis not present

## 2019-08-12 DIAGNOSIS — I10 Essential (primary) hypertension: Secondary | ICD-10-CM | POA: Diagnosis not present

## 2019-08-12 DIAGNOSIS — Z789 Other specified health status: Secondary | ICD-10-CM | POA: Diagnosis not present

## 2019-08-12 DIAGNOSIS — K760 Fatty (change of) liver, not elsewhere classified: Secondary | ICD-10-CM | POA: Diagnosis not present

## 2019-08-12 DIAGNOSIS — D2272 Melanocytic nevi of left lower limb, including hip: Secondary | ICD-10-CM | POA: Diagnosis not present

## 2019-08-12 DIAGNOSIS — E039 Hypothyroidism, unspecified: Secondary | ICD-10-CM | POA: Diagnosis not present

## 2019-08-12 DIAGNOSIS — L821 Other seborrheic keratosis: Secondary | ICD-10-CM | POA: Diagnosis not present

## 2019-08-12 DIAGNOSIS — D1801 Hemangioma of skin and subcutaneous tissue: Secondary | ICD-10-CM | POA: Diagnosis not present

## 2019-08-12 DIAGNOSIS — L814 Other melanin hyperpigmentation: Secondary | ICD-10-CM | POA: Diagnosis not present

## 2019-08-12 DIAGNOSIS — Z01419 Encounter for gynecological examination (general) (routine) without abnormal findings: Secondary | ICD-10-CM | POA: Diagnosis not present

## 2019-08-12 DIAGNOSIS — E559 Vitamin D deficiency, unspecified: Secondary | ICD-10-CM | POA: Diagnosis not present

## 2019-08-12 DIAGNOSIS — D225 Melanocytic nevi of trunk: Secondary | ICD-10-CM | POA: Diagnosis not present

## 2019-09-20 DIAGNOSIS — N760 Acute vaginitis: Secondary | ICD-10-CM | POA: Diagnosis not present

## 2019-09-20 MED FILL — NYSTATIN-TRIAMCINOLONE CRM: 100000-0.1 | 7 days supply | Qty: 15 | Fill #0

## 2019-09-20 MED FILL — TERCONAZOLE 0.4% VAG CREAM: 0.4 | 7 days supply | Qty: 45 | Fill #0

## 2019-10-01 MED FILL — AZITHROMYCIN 250 MG TABS: 250 | 5 days supply | Qty: 6 | Fill #0 | Status: TO

## 2019-10-01 MED FILL — AZITHROMYCIN 250 MG TABLET: 250 | 5 days supply | Qty: 6 | Fill #0

## 2019-10-01 MED FILL — MEDROL 4 MG DOSEPAK: 4 | 6 days supply | Qty: 21 | Fill #0 | Status: TO

## 2019-10-02 MED FILL — METHYLPREDNISOLONE 4 MG TBP: 4 | 6 days supply | Qty: 21 | Fill #0

## 2019-10-14 DIAGNOSIS — E039 Hypothyroidism, unspecified: Secondary | ICD-10-CM | POA: Diagnosis not present

## 2019-10-14 DIAGNOSIS — E118 Type 2 diabetes mellitus with unspecified complications: Secondary | ICD-10-CM | POA: Diagnosis not present

## 2019-10-20 DIAGNOSIS — B379 Candidiasis, unspecified: Secondary | ICD-10-CM | POA: Diagnosis not present

## 2019-10-20 DIAGNOSIS — I10 Essential (primary) hypertension: Secondary | ICD-10-CM | POA: Diagnosis not present

## 2019-10-20 DIAGNOSIS — E785 Hyperlipidemia, unspecified: Secondary | ICD-10-CM | POA: Diagnosis not present

## 2019-10-20 DIAGNOSIS — E039 Hypothyroidism, unspecified: Secondary | ICD-10-CM | POA: Diagnosis not present

## 2019-10-20 DIAGNOSIS — E118 Type 2 diabetes mellitus with unspecified complications: Secondary | ICD-10-CM | POA: Diagnosis not present

## 2019-10-20 DIAGNOSIS — Z6832 Body mass index (BMI) 32.0-32.9, adult: Secondary | ICD-10-CM | POA: Diagnosis not present

## 2019-10-26 DIAGNOSIS — I10 Essential (primary) hypertension: Secondary | ICD-10-CM | POA: Diagnosis not present

## 2019-10-26 DIAGNOSIS — E669 Obesity, unspecified: Secondary | ICD-10-CM | POA: Diagnosis not present

## 2019-10-26 DIAGNOSIS — K439 Ventral hernia without obstruction or gangrene: Secondary | ICD-10-CM | POA: Diagnosis not present

## 2019-10-26 DIAGNOSIS — E1169 Type 2 diabetes mellitus with other specified complication: Secondary | ICD-10-CM | POA: Diagnosis not present

## 2019-11-02 ENCOUNTER — Other Ambulatory Visit: Payer: Self-pay | Admitting: Internal Medicine

## 2019-11-02 DIAGNOSIS — Z1231 Encounter for screening mammogram for malignant neoplasm of breast: Secondary | ICD-10-CM

## 2019-12-08 ENCOUNTER — Ambulatory Visit
Admission: RE | Admit: 2019-12-08 | Discharge: 2019-12-08 | Disposition: A | Discharge status: Home / Self Care | Payer: PPO | Source: Ambulatory Visit | Attending: Internal Medicine | Admitting: Internal Medicine

## 2019-12-08 ENCOUNTER — Other Ambulatory Visit: Payer: Self-pay

## 2019-12-08 DIAGNOSIS — Z1231 Encounter for screening mammogram for malignant neoplasm of breast: Secondary | ICD-10-CM | POA: Diagnosis not present

## 2019-12-16 MED FILL — NYSTATIN-TRIAMCINOLONE CRM: 100000-0.1 | 7 days supply | Qty: 15 | Fill #0

## 2019-12-16 MED FILL — TERCONAZOLE 0.4% VAG CREAM: 0.4 | 7 days supply | Qty: 45 | Fill #0

## 2020-02-10 DIAGNOSIS — E119 Type 2 diabetes mellitus without complications: Secondary | ICD-10-CM | POA: Diagnosis not present

## 2020-02-10 DIAGNOSIS — E7800 Pure hypercholesterolemia, unspecified: Secondary | ICD-10-CM | POA: Diagnosis not present

## 2020-02-17 IMAGING — MG DIGITAL SCREENING BILATERAL MAMMOGRAM WITH TOMO AND CAD
8 series · 8 of 24 positions shown · non-contrast
Comparison: Previous exam(s).

CLINICAL DATA: Screening.

EXAM:
DIGITAL SCREENING BILATERAL MAMMOGRAM WITH TOMO AND CAD

[L CC synth-2D]
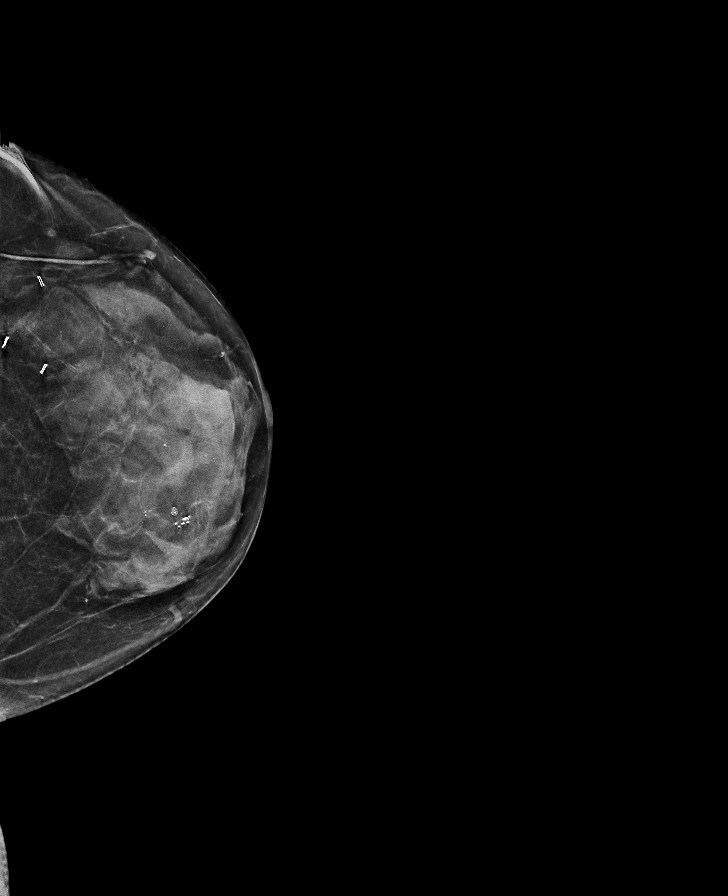

[L MLO synth-2D]
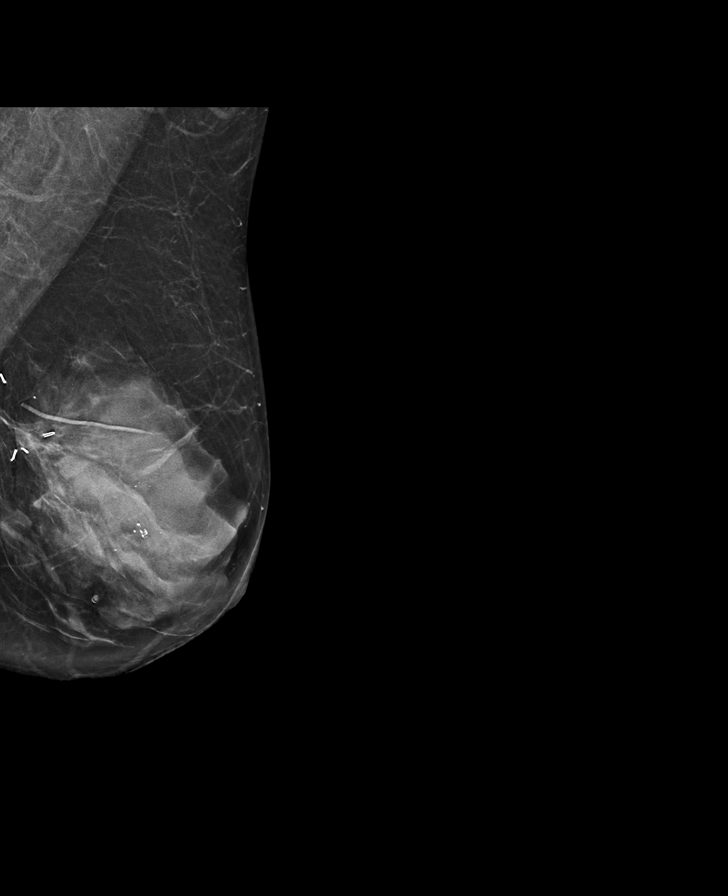

[R MLO synth-2D]
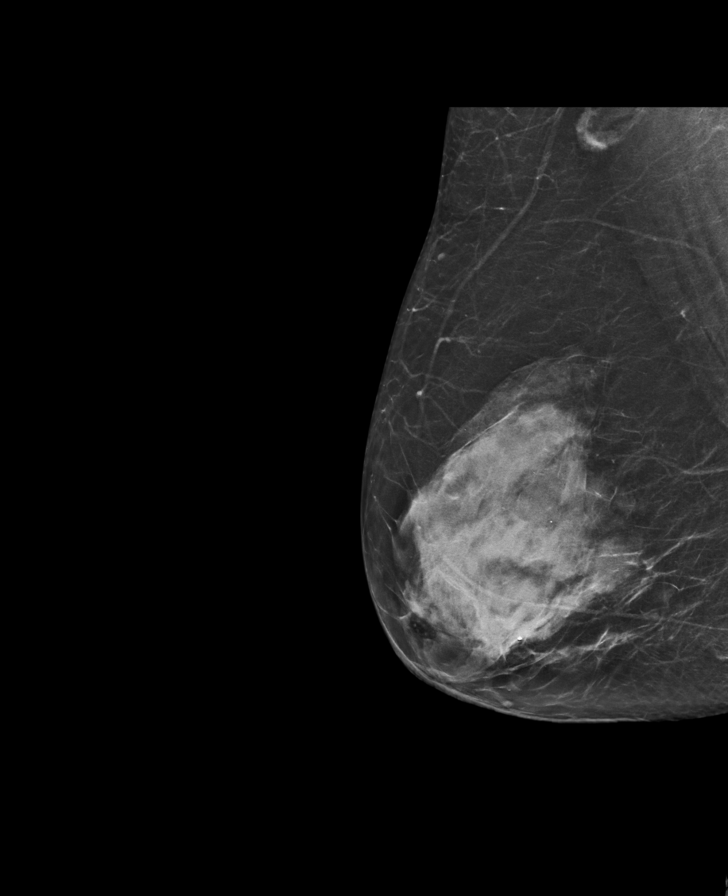

[R CC synth-2D]
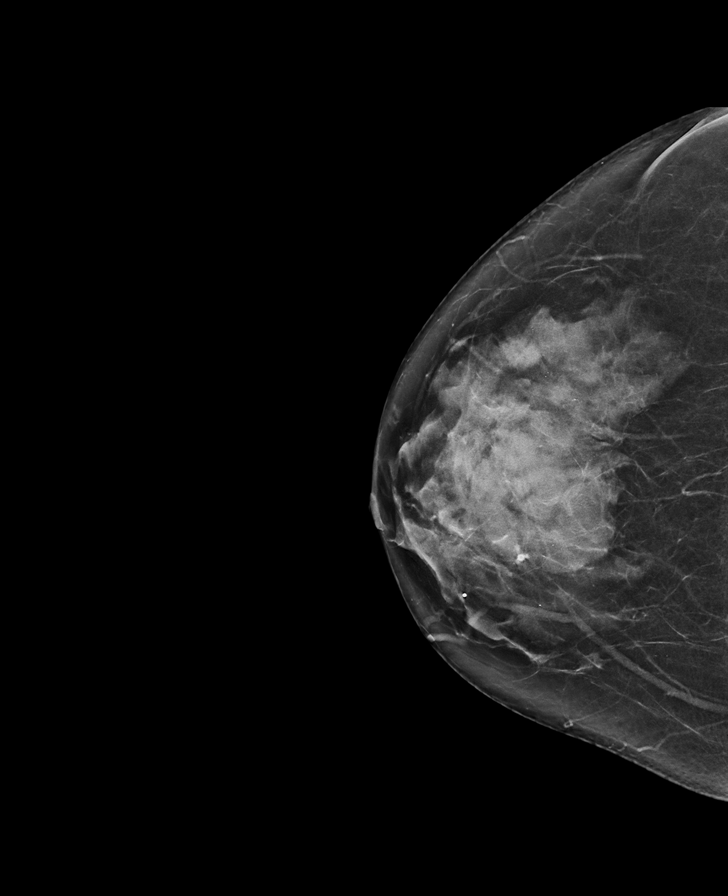

[L MLO tomo · tomo slice 31/62.0]
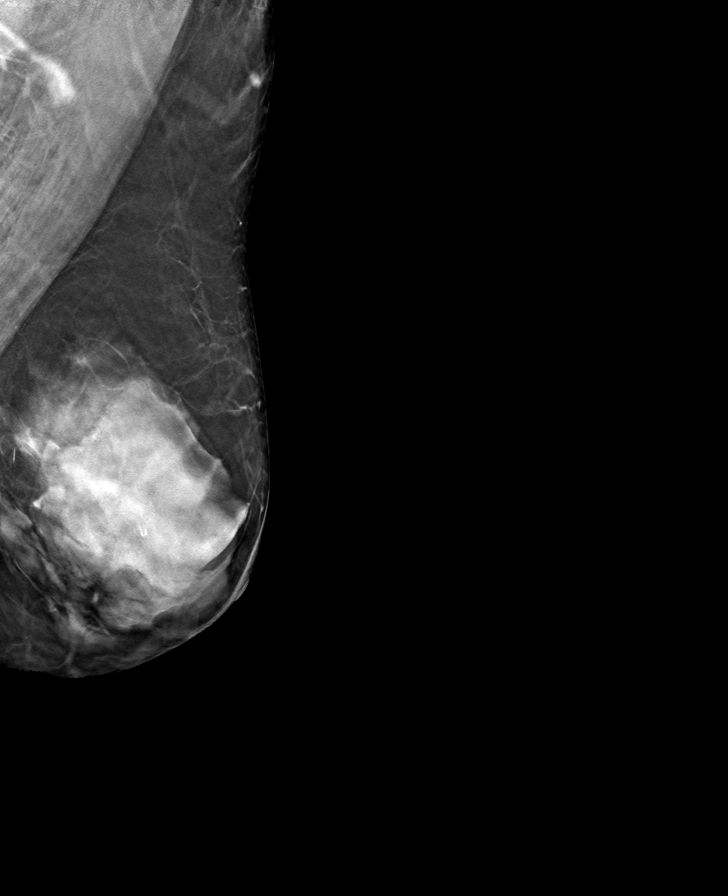

[R MLO tomo · tomo slice 37/72.0]
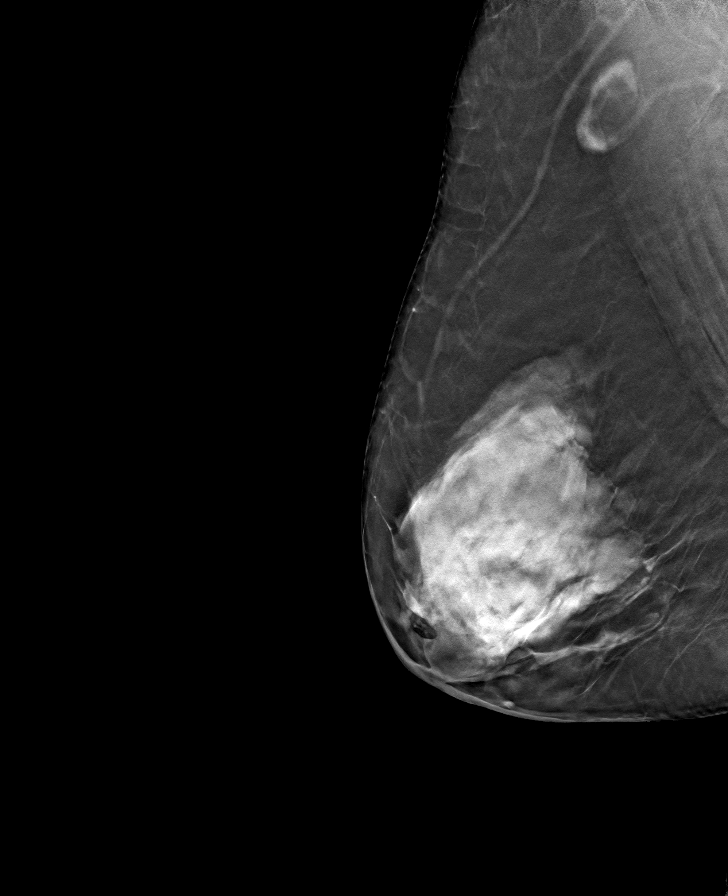

[L CC tomo · tomo slice 27/53.0]
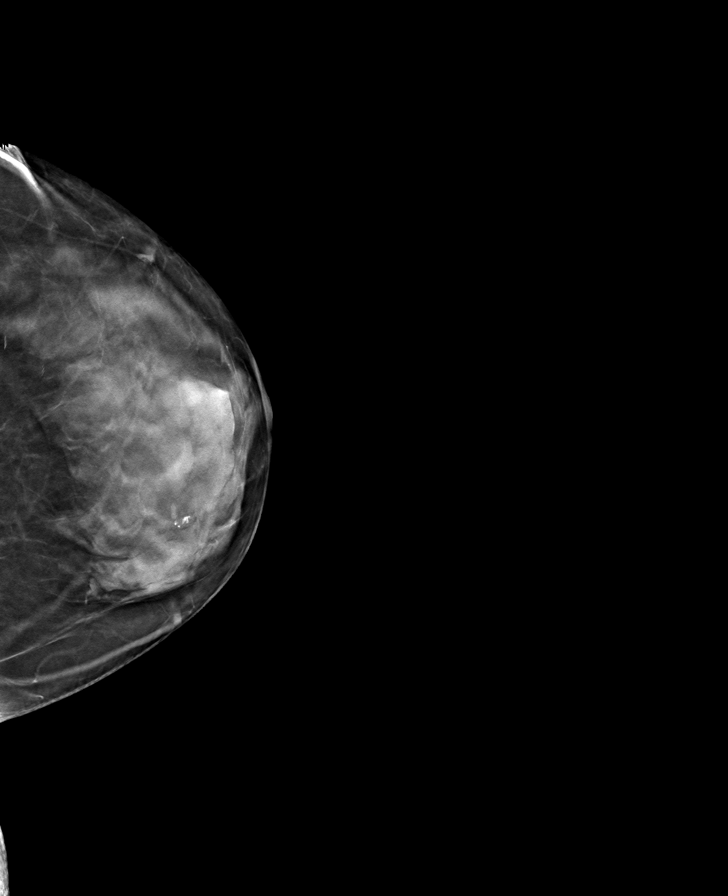

[R CC tomo · tomo slice 35/68.0]
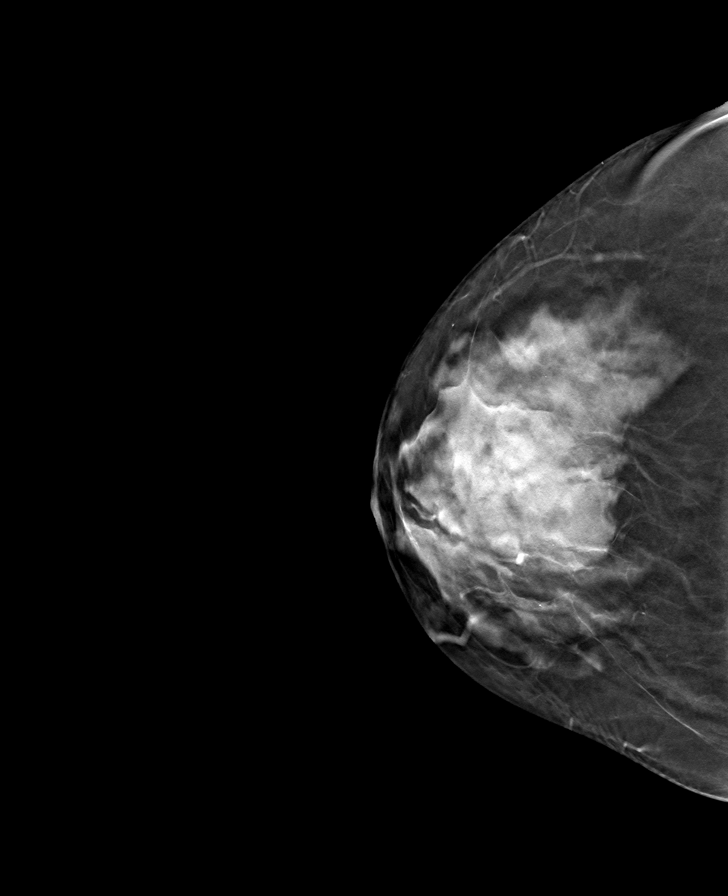

[8 of 24 positions shown; findings below may reference images not displayed]

ACR Breast Density Category d: The breast tissue is extremely dense,
which lowers the sensitivity of mammography.
FINDINGS: In the right breast, a possible asymmetry warrants further
evaluation. In the left breast, no findings suspicious for
malignancy. Images were processed with CAD.
IMPRESSION: Further evaluation is suggested for possible asymmetry in the right
breast.

RECOMMENDATION:
Diagnostic mammogram and possibly ultrasound of the right breast.
(Code:[id number])

The patient will be contacted regarding the findings, and additional
imaging will be scheduled.

BI-RADS CATEGORY  0: Incomplete. Need additional imaging evaluation
and/or prior mammograms for comparison.

## 2020-04-13 DIAGNOSIS — E118 Type 2 diabetes mellitus with unspecified complications: Secondary | ICD-10-CM | POA: Diagnosis not present

## 2020-04-13 DIAGNOSIS — E039 Hypothyroidism, unspecified: Secondary | ICD-10-CM | POA: Diagnosis not present

## 2020-04-18 DIAGNOSIS — B379 Candidiasis, unspecified: Secondary | ICD-10-CM | POA: Diagnosis not present

## 2020-04-18 DIAGNOSIS — E785 Hyperlipidemia, unspecified: Secondary | ICD-10-CM | POA: Diagnosis not present

## 2020-04-18 DIAGNOSIS — I10 Essential (primary) hypertension: Secondary | ICD-10-CM | POA: Diagnosis not present

## 2020-04-18 DIAGNOSIS — Z6832 Body mass index (BMI) 32.0-32.9, adult: Secondary | ICD-10-CM | POA: Diagnosis not present

## 2020-04-18 DIAGNOSIS — E039 Hypothyroidism, unspecified: Secondary | ICD-10-CM | POA: Diagnosis not present

## 2020-04-18 DIAGNOSIS — E118 Type 2 diabetes mellitus with unspecified complications: Secondary | ICD-10-CM | POA: Diagnosis not present

## 2020-04-20 DIAGNOSIS — K439 Ventral hernia without obstruction or gangrene: Secondary | ICD-10-CM | POA: Diagnosis not present

## 2020-04-20 DIAGNOSIS — E1169 Type 2 diabetes mellitus with other specified complication: Secondary | ICD-10-CM | POA: Diagnosis not present

## 2020-04-20 DIAGNOSIS — E669 Obesity, unspecified: Secondary | ICD-10-CM | POA: Diagnosis not present

## 2020-04-20 DIAGNOSIS — I10 Essential (primary) hypertension: Secondary | ICD-10-CM | POA: Diagnosis not present

## 2020-04-20 DIAGNOSIS — Z853 Personal history of malignant neoplasm of breast: Secondary | ICD-10-CM | POA: Diagnosis not present

## 2020-05-02 MED FILL — FREESTYLE LITE TEST STRIP: 50 days supply | Qty: 50 | Fill #0

## 2020-05-03 MED FILL — FREESTYLE LITE METER: 1 days supply | Qty: 1 | Fill #0

## 2020-09-27 DIAGNOSIS — D1801 Hemangioma of skin and subcutaneous tissue: Secondary | ICD-10-CM | POA: Diagnosis not present

## 2020-09-27 DIAGNOSIS — D225 Melanocytic nevi of trunk: Secondary | ICD-10-CM | POA: Diagnosis not present

## 2020-09-27 DIAGNOSIS — E039 Hypothyroidism, unspecified: Secondary | ICD-10-CM | POA: Diagnosis not present

## 2020-09-27 DIAGNOSIS — D2272 Melanocytic nevi of left lower limb, including hip: Secondary | ICD-10-CM | POA: Diagnosis not present

## 2020-09-27 DIAGNOSIS — Z0000 Encounter for general adult medical examination without abnormal findings: Secondary | ICD-10-CM | POA: Diagnosis not present

## 2020-09-27 DIAGNOSIS — L814 Other melanin hyperpigmentation: Secondary | ICD-10-CM | POA: Diagnosis not present

## 2020-09-27 DIAGNOSIS — L821 Other seborrheic keratosis: Secondary | ICD-10-CM | POA: Diagnosis not present

## 2020-09-27 DIAGNOSIS — E119 Type 2 diabetes mellitus without complications: Secondary | ICD-10-CM | POA: Diagnosis not present

## 2020-09-27 DIAGNOSIS — D485 Neoplasm of uncertain behavior of skin: Secondary | ICD-10-CM | POA: Diagnosis not present

## 2020-09-27 DIAGNOSIS — I10 Essential (primary) hypertension: Secondary | ICD-10-CM | POA: Diagnosis not present

## 2020-09-27 DIAGNOSIS — Z85820 Personal history of malignant melanoma of skin: Secondary | ICD-10-CM | POA: Diagnosis not present

## 2020-09-27 DIAGNOSIS — E7800 Pure hypercholesterolemia, unspecified: Secondary | ICD-10-CM | POA: Diagnosis not present

## 2020-09-27 DIAGNOSIS — D692 Other nonthrombocytopenic purpura: Secondary | ICD-10-CM | POA: Diagnosis not present

## 2020-10-04 DIAGNOSIS — E559 Vitamin D deficiency, unspecified: Secondary | ICD-10-CM | POA: Diagnosis not present

## 2020-10-04 DIAGNOSIS — E118 Type 2 diabetes mellitus with unspecified complications: Secondary | ICD-10-CM | POA: Diagnosis not present

## 2020-10-04 DIAGNOSIS — Z0000 Encounter for general adult medical examination without abnormal findings: Secondary | ICD-10-CM | POA: Diagnosis not present

## 2020-10-04 DIAGNOSIS — E7800 Pure hypercholesterolemia, unspecified: Secondary | ICD-10-CM | POA: Diagnosis not present

## 2020-10-04 DIAGNOSIS — E039 Hypothyroidism, unspecified: Secondary | ICD-10-CM | POA: Diagnosis not present

## 2020-10-04 DIAGNOSIS — Z789 Other specified health status: Secondary | ICD-10-CM | POA: Diagnosis not present

## 2020-12-12 DIAGNOSIS — B379 Candidiasis, unspecified: Secondary | ICD-10-CM | POA: Diagnosis not present

## 2020-12-12 DIAGNOSIS — Z6832 Body mass index (BMI) 32.0-32.9, adult: Secondary | ICD-10-CM | POA: Diagnosis not present

## 2020-12-12 DIAGNOSIS — E785 Hyperlipidemia, unspecified: Secondary | ICD-10-CM | POA: Diagnosis not present

## 2020-12-12 DIAGNOSIS — I10 Essential (primary) hypertension: Secondary | ICD-10-CM | POA: Diagnosis not present

## 2020-12-12 DIAGNOSIS — E118 Type 2 diabetes mellitus with unspecified complications: Secondary | ICD-10-CM | POA: Diagnosis not present

## 2020-12-12 DIAGNOSIS — E039 Hypothyroidism, unspecified: Secondary | ICD-10-CM | POA: Diagnosis not present

## 2021-01-18 ENCOUNTER — Other Ambulatory Visit: Payer: Self-pay | Admitting: Registered Nurse

## 2021-01-18 DIAGNOSIS — Z1231 Encounter for screening mammogram for malignant neoplasm of breast: Secondary | ICD-10-CM

## 2021-01-25 ENCOUNTER — Other Ambulatory Visit (HOSPITAL_COMMUNITY): Payer: Self-pay

## 2021-01-25 MED ORDER — GLUCOSE BLOOD VI STRP
ORAL_STRIP | 0 refills | Status: AC
  Filled 2021-01-25: qty 50, 50d supply, fill #0

## 2021-04-04 ENCOUNTER — Other Ambulatory Visit: Payer: Self-pay

## 2021-04-04 ENCOUNTER — Ambulatory Visit
Admission: RE | Admit: 2021-04-04 | Discharge: 2021-04-04 | Disposition: A | Discharge status: Home / Self Care | Payer: PPO | Source: Ambulatory Visit | Attending: Registered Nurse | Admitting: Registered Nurse

## 2021-04-04 DIAGNOSIS — Z1231 Encounter for screening mammogram for malignant neoplasm of breast: Secondary | ICD-10-CM | POA: Diagnosis not present

## 2021-04-11 DIAGNOSIS — E039 Hypothyroidism, unspecified: Secondary | ICD-10-CM | POA: Diagnosis not present

## 2021-04-11 DIAGNOSIS — E118 Type 2 diabetes mellitus with unspecified complications: Secondary | ICD-10-CM | POA: Diagnosis not present

## 2021-04-11 DIAGNOSIS — E7800 Pure hypercholesterolemia, unspecified: Secondary | ICD-10-CM | POA: Diagnosis not present

## 2021-04-18 DIAGNOSIS — B379 Candidiasis, unspecified: Secondary | ICD-10-CM | POA: Diagnosis not present

## 2021-04-18 DIAGNOSIS — I10 Essential (primary) hypertension: Secondary | ICD-10-CM | POA: Diagnosis not present

## 2021-04-18 DIAGNOSIS — Z6832 Body mass index (BMI) 32.0-32.9, adult: Secondary | ICD-10-CM | POA: Diagnosis not present

## 2021-04-18 DIAGNOSIS — E039 Hypothyroidism, unspecified: Secondary | ICD-10-CM | POA: Diagnosis not present

## 2021-04-18 DIAGNOSIS — E785 Hyperlipidemia, unspecified: Secondary | ICD-10-CM | POA: Diagnosis not present

## 2021-04-18 DIAGNOSIS — E118 Type 2 diabetes mellitus with unspecified complications: Secondary | ICD-10-CM | POA: Diagnosis not present

## 2021-05-17 ENCOUNTER — Other Ambulatory Visit (HOSPITAL_COMMUNITY): Payer: Self-pay

## 2021-05-17 MED ORDER — OZEMPIC (0.25 OR 0.5 MG/DOSE) 2 MG/1.5ML ~~LOC~~ SOPN
0.2500 mg | PEN_INJECTOR | SUBCUTANEOUS | 1 refills | Status: DC
  Filled 2021-05-17: qty 1.5, 56d supply, fill #0

## 2021-07-26 ENCOUNTER — Other Ambulatory Visit (HOSPITAL_COMMUNITY): Payer: Self-pay

## 2021-07-26 DIAGNOSIS — N760 Acute vaginitis: Secondary | ICD-10-CM | POA: Diagnosis not present

## 2021-07-26 MED ORDER — CLOTRIMAZOLE-BETAMETHASONE 1-0.05 % EX CREA
TOPICAL_CREAM | CUTANEOUS | 3 refills | Status: AC
  Filled 2021-07-26: qty 45, 14d supply, fill #0
  Filled 2021-11-05: qty 45, 14d supply, fill #1
  Filled 2022-02-15: qty 45, 14d supply, fill #2
  Filled 2022-05-06: qty 45, 14d supply, fill #3

## 2021-08-13 DIAGNOSIS — E119 Type 2 diabetes mellitus without complications: Secondary | ICD-10-CM | POA: Diagnosis not present

## 2021-08-13 DIAGNOSIS — E559 Vitamin D deficiency, unspecified: Secondary | ICD-10-CM | POA: Diagnosis not present

## 2021-08-24 DIAGNOSIS — B379 Candidiasis, unspecified: Secondary | ICD-10-CM | POA: Diagnosis not present

## 2021-08-24 DIAGNOSIS — Z8249 Family history of ischemic heart disease and other diseases of the circulatory system: Secondary | ICD-10-CM | POA: Diagnosis not present

## 2021-08-24 DIAGNOSIS — Z6832 Body mass index (BMI) 32.0-32.9, adult: Secondary | ICD-10-CM | POA: Diagnosis not present

## 2021-08-24 DIAGNOSIS — E039 Hypothyroidism, unspecified: Secondary | ICD-10-CM | POA: Diagnosis not present

## 2021-08-24 DIAGNOSIS — E785 Hyperlipidemia, unspecified: Secondary | ICD-10-CM | POA: Diagnosis not present

## 2021-08-24 DIAGNOSIS — I10 Essential (primary) hypertension: Secondary | ICD-10-CM | POA: Diagnosis not present

## 2021-08-24 DIAGNOSIS — E118 Type 2 diabetes mellitus with unspecified complications: Secondary | ICD-10-CM | POA: Diagnosis not present

## 2021-10-08 ENCOUNTER — Other Ambulatory Visit: Payer: Self-pay | Admitting: Registered Nurse

## 2021-10-08 DIAGNOSIS — E7800 Pure hypercholesterolemia, unspecified: Secondary | ICD-10-CM

## 2021-10-10 ENCOUNTER — Other Ambulatory Visit: Payer: Self-pay | Admitting: Registered Nurse

## 2021-10-10 DIAGNOSIS — Z8249 Family history of ischemic heart disease and other diseases of the circulatory system: Secondary | ICD-10-CM

## 2021-10-11 ENCOUNTER — Other Ambulatory Visit: Payer: Self-pay | Admitting: Registered Nurse

## 2021-10-11 DIAGNOSIS — K439 Ventral hernia without obstruction or gangrene: Secondary | ICD-10-CM

## 2021-10-22 ENCOUNTER — Other Ambulatory Visit: Payer: PPO

## 2021-10-23 ENCOUNTER — Ambulatory Visit
Admission: RE | Admit: 2021-10-23 | Discharge: 2021-10-23 | Disposition: A | Discharge status: Home / Self Care | Payer: PPO | Source: Ambulatory Visit | Attending: Registered Nurse | Admitting: Registered Nurse

## 2021-10-23 DIAGNOSIS — K439 Ventral hernia without obstruction or gangrene: Secondary | ICD-10-CM

## 2021-10-23 DIAGNOSIS — Z8249 Family history of ischemic heart disease and other diseases of the circulatory system: Secondary | ICD-10-CM

## 2021-10-25 ENCOUNTER — Ambulatory Visit
Admission: RE | Admit: 2021-10-25 | Discharge: 2021-10-25 | Disposition: A | Discharge status: Home / Self Care | Payer: PPO | Source: Ambulatory Visit | Attending: Registered Nurse | Admitting: Registered Nurse

## 2021-10-25 ENCOUNTER — Other Ambulatory Visit: Payer: Self-pay

## 2021-10-25 DIAGNOSIS — E7800 Pure hypercholesterolemia, unspecified: Secondary | ICD-10-CM

## 2021-10-31 ENCOUNTER — Telehealth: Payer: Self-pay

## 2021-10-31 NOTE — Telephone Encounter (Signed)
NOTES SCANNED TO REFERRAL 

## 2021-11-05 ENCOUNTER — Other Ambulatory Visit (HOSPITAL_COMMUNITY): Payer: Self-pay

## 2021-11-30 ENCOUNTER — Encounter: Payer: Self-pay | Admitting: Interventional Cardiology

## 2021-11-30 ENCOUNTER — Ambulatory Visit: Payer: PPO | Admitting: Interventional Cardiology

## 2021-11-30 ENCOUNTER — Other Ambulatory Visit: Payer: Self-pay

## 2021-11-30 VITALS — BP 128/82 | HR 74 | Ht 63.0 in | Wt 193.6 lb

## 2021-11-30 DIAGNOSIS — K760 Fatty (change of) liver, not elsewhere classified: Secondary | ICD-10-CM | POA: Diagnosis not present

## 2021-11-30 DIAGNOSIS — I10 Essential (primary) hypertension: Secondary | ICD-10-CM

## 2021-11-30 DIAGNOSIS — Z794 Long term (current) use of insulin: Secondary | ICD-10-CM

## 2021-11-30 DIAGNOSIS — I25119 Atherosclerotic heart disease of native coronary artery with unspecified angina pectoris: Secondary | ICD-10-CM | POA: Diagnosis not present

## 2021-11-30 DIAGNOSIS — E118 Type 2 diabetes mellitus with unspecified complications: Secondary | ICD-10-CM

## 2021-11-30 DIAGNOSIS — E785 Hyperlipidemia, unspecified: Secondary | ICD-10-CM

## 2021-11-30 NOTE — Progress Notes (Signed)
?Cardiology Office Note:   ? ?Date:  11/30/2021  ? ?ID:  Susan Montes, DOB 15-Jun-1951, MRN [id number] ? ?PCP:  Jani Gravel, MD  ?Cardiologist:  None  ? ?Referring MD: Holland Commons, FNP  ? ?Chief Complaint  ?Patient presents with  ? Coronary Artery Disease  ? ? ?History of Present Illness:   ? ?Susan Montes is a 71 y.o. female for evaluation of asymptomatic coronary disease as denoted by a coronary calcium score of 355.  Also noted on the scan was evidence of hepatic steatosis. ? ?Cardiovascular risk factors include diabetes mellitus type 2, diabetic nephropathy, hyperlipidemia, primary hypertension, prior smoker, and death of a sister and brother from ruptured abdominal aortic aneurysm ? ?He refuses to take statins.  She has tried them in the past and has significant muscle discomfort.  I cannot encourage her to take even 5 mg of rosuvastatin a day. ? ?She has no exertional intolerance, dyspnea, edema, orthopnea, or palpitations.  Recent abdominal ultrasound without evidence of aortic aneurysm ? ?Past Medical History:  ?Diagnosis Date  ? Arthritis   ? Breast cancer ([place])   ? left  ? Cancer [facility]) 1999  ? breast- left  ? Diabetes mellitus   ? Diabetic nephropathy ([place])   ? Fatty liver   ? Hemorrhoids   ? History of ovarian cyst   ? Hyperlipidemia   ? Hypertension   ? Hypothyroidism   ? Melanoma ([place]) 2018  ? right arm  ? Personal history of radiation therapy   ? PONV (postoperative nausea and vomiting)   ? Rosacea   ? Sigmoid diverticulosis   ? Vaginal delivery   ? x2  ? Ventral hernia   ? ? ?Past Surgical History:  ?Procedure Laterality Date  ? ABDOMINAL HYSTERECTOMY  2008  ? BREAST LUMPECTOMY Left   ? 1999  ? BREAST SURGERY  1998  ? lumpectomies april and may  ? COLONOSCOPY  2010  ? DIAGNOSTIC LAPAROSCOPY  1988  ? HEMANGIOMA EXCISION    ? right  ? HERNIA REPAIR  2009, 2010  ? LAPAROSCOPIC ABDOMINAL EXPLORATION N/A 12/22/2017  ? Procedure: LAPAROSCOPIC AND OPEN LOWER ABDOMINAL WALL HERNIA, WITH REMOVAL OF  MESH ERAS PATHWAY;  Surgeon: Alphonsa Overall, MD;  Location: [facility] ORS;  Service: General;  Laterality: N/A;  INSERTION OF MESH  ? WISDOM TOOTH EXTRACTION    ? ? ?Current Medications: ?Current Meds  ?Medication Sig  ? acyclovir cream (ZOVIRAX) 5 % as needed.  ? Aspirin-Caffeine (BC FAST PAIN RELIEF PO) Take 1 packet by mouth daily as needed (pain).  ? Cholecalciferol (VITAMIN D3) 5000 units CAPS Take 5,000 Units by mouth at bedtime.   ? clobetasol (TEMOVATE) 0.05 % external solution Apply 1 application daily as needed topically (for scalp).  ? clotrimazole-betamethasone (LOTRISONE) cream APPLY TO THE AFFECTED AND SURROUNDING AREAS OF SKIN TOPICALLY 2 TIMES PER DAY IN THE MORNING AND EVENING FOR 2 WEEKS  ? glucose blood test strip Use 1 to check blood glucose once daily.  ? ibuprofen (ADVIL,MOTRIN) 200 MG tablet Take 400 mg every 6 (six) hours as needed by mouth for headache or moderate pain.   ? levothyroxine (SYNTHROID, LEVOTHROID) 75 MCG tablet Take 75 mcg daily before breakfast by mouth.  ? Multiple Vitamin (MULTIVITAMIN PO) Take 1 tablet by mouth daily.    ? naproxen sodium (ALEVE) 220 MG tablet Take 220 mg 2 (two) times daily as needed by mouth (for pain/headaches).  ? nebivolol (BYSTOLIC) 5 MG tablet Take 5 mg  by mouth daily.  ? Semaglutide,0.25 or 0.'5MG'$ /DOS, (OZEMPIC, 0.25 OR 0.5 MG/DOSE,) 2 MG/1.5ML SOPN Inject 0.25 mg into the skin once a week.  ? TURMERIC PO Take 1,000 mg by mouth at bedtime.  ?  ? ?Allergies:   Anesthetics, amide; Colesevelam; Linagliptin; Statins; Sulfacetamide sodium; and Ezetimibe  ? ?Social History  ? ?Socioeconomic History  ? Marital status: Married  ?  Spouse name: Not on file  ? Number of children: Not on file  ? Years of education: Not on file  ? Highest education level: Not on file  ?Occupational History  ? Not on file  ?Tobacco Use  ? Smoking status: Former  ?  Types: Cigarettes  ?  Quit date: 09/09/1986  ?  Years since quitting: 35.2  ? Smokeless tobacco: Never  ?Vaping Use  ? Vaping  Use: Never used  ?Substance and Sexual Activity  ? Alcohol use: Yes  ?  Alcohol/week: 0.0 standard drinks  ?  Comment: occ  ? Drug use: No  ? Sexual activity: Not on file  ?Other Topics Concern  ? Not on file  ?Social History Narrative  ? Not on file  ? ?Social Determinants of Health  ? ?Financial Resource Strain: Not on file  ?Food Insecurity: Not on file  ?Transportation Needs: Not on file  ?Physical Activity: Not on file  ?Stress: Not on file  ?Social Connections: Not on file  ?  ? ?Family History: ?The patient's family history includes Breast cancer in her maternal grandmother; Cancer in her maternal grandmother; Diabetes in her father; Hypertension in her father. There is no history of Heart attack or Hyperlipidemia. ? ?ROS:   ?Please see the history of present illness.    ?[place] against statin therapy because of aches and pains.  She has tried multiple.  I cannot convince her to even try 5 mg of rosuvastatin once per week.  All other systems reviewed and are negative. ? ?EKGs/Labs/Other Studies Reviewed:   ? ?The following studies were reviewed today: ?Abdominal ultrasound 10/23/2021: ?IMPRESSION: ?1. Aortic atherosclerosis without evidence of abdominal aortic ?aneurysm. ?2. Heterogeneous and echogenic visualized liver likely reflecting ?underlying hepatic steatosis. ?Laboratory data 2022: ?Hemoglobin A1c 7.6 ?LDL cholesterol 114 ? ?EKG:  EKG EKG reveals right atrial abnormality, small inferior Q waves probably not significant.  Overall essentially normal. ? ?Recent Labs: ?No results found for requested labs within last 8760 hours.  ?Recent Lipid Panel ?No results found for: CHOL, TRIG, HDL, CHOLHDL, VLDL, LDLCALC, LDLDIRECT ? ?Physical Exam:   ? ?VS:  BP 128/82   Pulse 74   Ht '5\' 3"'$  (1.6 m)   Wt 193 lb 9.6 oz (87.8 kg)   SpO2 97%   BMI 34.29 kg/m?    ? ?Wt Readings from Last 3 Encounters:  ?11/30/21 193 lb 9.6 oz (87.8 kg)  ?12/22/17 196 lb (88.9 kg)  ?12/15/17 196 lb (88.9 kg)  ?  ? ?GEN: Obese. No  acute distress ?HEENT: Normal ?NECK: No JVD. ?LYMPHATICS: No lymphadenopathy ?CARDIAC: No murmur. RRR no gallop, or edema. ?VASCULAR:  Normal Pulses. No bruits. ?RESPIRATORY:  Clear to auscultation without rales, wheezing or rhonchi  ?ABDOMEN: Soft, non-tender, non-distended, No pulsatile mass, ?MUSCULOSKELETAL: No deformity  ?SKIN: Warm and dry ?NEUROLOGIC:  Alert and oriented x 3 ?PSYCHIATRIC:  Normal affect  ? ?ASSESSMENT:   ? ?1. Coronary artery disease involving native coronary artery of native heart with angina pectoris ([place])   ?2. Primary hypertension   ?3. Type 2 diabetes mellitus with complication, with long-term  current use of insulin ([place])   ?4. Hepatic steatosis   ?5. Hyperlipidemia with target LDL less than 70   ? ?PLAN:   ? ?In order of problems listed above: ? ?Aggressive secondary prevention discussed in detail.  See below. ?Target 130/80 mmHg.  Low-salt diet, weight loss, and physical activity encouraged ?Target A1c less than 7. ?This is a CV risk factor.  Weight loss is indicated.  She does not drink alcohol. ?Referral to the lipid clinic.  Target LDL less than 70 is desired.  Most recent LDL from [facility] was 114. ? ? ?Medication Adjustments/Labs and Tests Ordered: ?Current medicines are reviewed at length with the patient today.  Concerns regarding medicines are outlined above.  ?Orders Placed This Encounter  ?Procedures  ? AMB Referral to [facility] Pharm-D  ? EKG 12-Lead  ? ?No orders of the defined types were placed in this encounter. ? ? ?Patient Instructions  ?Medication Instructions:  ?Your physician recommends that you continue on your current medications as directed. Please refer to the Current Medication list given to you today. ? ?*If you need a refill on your cardiac medications before your next appointment, please call your pharmacy* ? ? ?Lab Work: ?None ?If you have labs (blood work) drawn today and your tests are completely normal, you will receive your results  only by: ?MyChart Message (if you have MyChart) OR ?A paper copy in the mail ?If you have any lab test that is abnormal or we need to change your treatment, we will call you to review the results. ? ? ?Testing

## 2021-11-30 NOTE — Patient Instructions (Signed)
Medication Instructions:  ?Your physician recommends that you continue on your current medications as directed. Please refer to the Current Medication list given to you today. ? ?*If you need a refill on your cardiac medications before your next appointment, please call your pharmacy* ? ? ?Lab Work: ?None ?If you have labs (blood work) drawn today and your tests are completely normal, you will receive your results only by: ?MyChart Message (if you have MyChart) OR ?A paper copy in the mail ?If you have any lab test that is abnormal or we need to change your treatment, we will call you to review the results. ? ? ?Testing/Procedures: ?None ? ? ?Follow-Up: ? ?Your physician recommends that you schedule a follow-up appointment next available with the Lipid Clinic. ? ? ?At [facility], you and your health needs are our priority.  As part of our continuing mission to provide you with exceptional heart care, we have created designated Provider Care Teams.  These Care Teams include your primary Cardiologist (physician) and Advanced Practice Providers (APPs -  Physician Assistants and Nurse Practitioners) who all work together to provide you with the care you need, when you need it. ? ?We recommend signing up for the patient portal called "MyChart".  Sign up information is provided on this After Visit Summary.  MyChart is used to connect with patients for Virtual Visits (Telemedicine).  Patients are able to view lab/test results, encounter notes, upcoming appointments, etc.  Non-urgent messages can be sent to your provider as well.   ?To learn more about what you can do with MyChart, go to [email or web address].   ? ?Your next appointment:   ?1 year(s) ? ?The format for your next appointment:   ?In Person ? ?Provider:   ?[facility]. Blenda Bridegroom, MD  ? ? ?Other Instructions ?  ?

## 2021-12-24 NOTE — Progress Notes (Signed)
Patient ID: Susan Montes                 DOB: 16-Oct-1950                    MRN: [id number] ? ? ? ? ?HPI: ?Susan Montes is a 71 y.o. female patient referred to lipid clinic by Dr. Tamala Julian. PMH is significant for ASCVD (elevated CAC), DM, HTN, and angina. Pt had CT calcium test done on 10/25/21 that showed score of 355 which is the 91st percentile for pt's age, sex, and race. Dr. Tamala Julian saw her on 11/30/21 and discussed score. Pt refused to take a statin since she has tried them in the past and had significant muscle discomfort. Dr. Tamala Julian tried to get her on low dose rosuvastatin 5 mg once weekly, but pt refused. ? ?Pt presents today for lipid clinic appointment. Has tried multiple statins in the past (Crestor and Lipitor) and reports she could not walk across the room d/t pain in her hands/knees. She prefers Repatha since her husband is also taking it and he has zero side effects from it. Stated she already knew how the pen worked and was used to giving herself injections because she is on Ozempic. Pt is getting hernia repair surgery on 01/23/22. ? ?Current Medications: None ?Intolerances: rosuvastatin and atorvastatin - myalgias, HA; ezetimibe - palpitations; colesevelam - abdominal pain ?Risk Factors: ASCVD, HTN, DM, family hx of death from ruptured abdominal aortic aneurysm (sister at 55 and father at 72) ? ?LDL goal: <70 mg/dL ? ?Labs (04/11/21): TC 226, HDL 51, LDL 114, TG 335 (10/04/21) ?  ?Diet: A lot of vegetables, chicken; breakfast - eggs, avocado toast; cuts down on carbs; does not eat a lot of beef ? ?Exercise: Could use improvement; trying to get up to 5x per week (3x per week currently) ?  ?Family History: Father - DM, HTN ? ?Social History: Denies illicit drug and alcohol use. Used to smoke, quit 35 years ago. ? ?Past Medical History:  ?Diagnosis Date  ? Arthritis   ? Breast cancer ([place])   ? left  ? Cancer [facility]) 1999  ? breast- left  ? Diabetes mellitus   ? Diabetic nephropathy ([place])   ? Fatty liver   ?  Hemorrhoids   ? History of ovarian cyst   ? Hyperlipidemia   ? Hypertension   ? Hypothyroidism   ? Melanoma ([place]) 2018  ? right arm  ? Personal history of radiation therapy   ? PONV (postoperative nausea and vomiting)   ? Rosacea   ? Sigmoid diverticulosis   ? Vaginal delivery   ? x2  ? Ventral hernia   ? ? ?Current Outpatient Medications on File Prior to Visit  ?Medication Sig Dispense Refill  ? acyclovir cream (ZOVIRAX) 5 % as needed.    ? Aspirin-Caffeine (BC FAST PAIN RELIEF PO) Take 1 packet by mouth daily as needed (pain).    ? Cholecalciferol (VITAMIN D3) 5000 units CAPS Take 5,000 Units by mouth at bedtime.     ? clobetasol (TEMOVATE) 0.05 % external solution Apply 1 application daily as needed topically (for scalp).    ? clotrimazole-betamethasone (LOTRISONE) cream APPLY TO THE AFFECTED AND SURROUNDING AREAS OF SKIN TOPICALLY 2 TIMES PER DAY IN THE MORNING AND EVENING FOR 2 WEEKS 45 g 3  ? glucose blood test strip Use 1 to check blood glucose once daily. 450 each 0  ? ibuprofen (ADVIL,MOTRIN) 200 MG tablet Take 400 mg  every 6 (six) hours as needed by mouth for headache or moderate pain.     ? levothyroxine (SYNTHROID, LEVOTHROID) 75 MCG tablet Take 75 mcg daily before breakfast by mouth.    ? Multiple Vitamin (MULTIVITAMIN PO) Take 1 tablet by mouth daily.      ? naproxen sodium (ALEVE) 220 MG tablet Take 220 mg 2 (two) times daily as needed by mouth (for pain/headaches).    ? nebivolol (BYSTOLIC) 5 MG tablet Take 5 mg by mouth daily.    ? Semaglutide,0.25 or 0.'5MG'$ /DOS, (OZEMPIC, 0.25 OR 0.5 MG/DOSE,) 2 MG/1.5ML SOPN Inject 0.25 mg into the skin once a week. 1.5 mL 1  ? TURMERIC PO Take 1,000 mg by mouth at bedtime.    ? ?No current facility-administered medications on file prior to visit.  ? ?Allergies  ?Allergen Reactions  ? Anesthetics, Amide Nausea And Vomiting  ? Colesevelam   ?  Other reaction(s): Abdominal Pain  ? Linagliptin   ?  Other reaction(s): Headache, headache and arthralgias but tolerates  other dpp4 inh  ? Statins   ?  Myalgia, Headache  ? Sulfacetamide Sodium   ?  Other reaction(s): Unknown  ? Ezetimibe Palpitations  ?  Other reaction(s): tachycardia  ? ?Assessment/Plan: ? ?1. Hyperlipidemia - LDL 114 last August. LDL goal <70 due to elevated calcium score of 355 and history of angina. Pt agreeable to starting Repatha since her husband is on it with zero side effects and she knows how to use the pen + already gives herself injections with Ozempic. Prior auth for Repatha has been approved and sent to her pharmacy.Told pt she can start taking before or after her surgery in May, whichever is better for her. She stated she would start it before her surgery. Educated pt she can inject Repatha in her upper outer thigh given mesh from hernia repair surgery will block stomach area. Told to call if she experiences any side effects such as muscle or joint weakness/pain. Will recheck lipids and LFTs on 02/19/22 after being on the medication for 8 weeks. ? ?Patient seen with Debria Garret, [place] pharmacy student ? ?

## 2021-12-25 ENCOUNTER — Ambulatory Visit: Payer: PPO | Admitting: Student-PharmD

## 2021-12-25 ENCOUNTER — Encounter: Payer: Self-pay | Admitting: Student-PharmD

## 2021-12-25 DIAGNOSIS — E785 Hyperlipidemia, unspecified: Secondary | ICD-10-CM | POA: Diagnosis not present

## 2021-12-25 DIAGNOSIS — I25119 Atherosclerotic heart disease of native coronary artery with unspecified angina pectoris: Secondary | ICD-10-CM

## 2021-12-25 MED ORDER — REPATHA SURECLICK 140 MG/ML ~~LOC~~ SOAJ: 140.0000 mg | SUBCUTANEOUS | 3 refills | Status: DC

## 2021-12-25 NOTE — Progress Notes (Deleted)
Patient ID: Susan Montes                 DOB: 1950/09/16                    MRN: [id number] ? ? ? ? ?HPI: ?Susan Montes is a 71 y.o. female patient referred to lipid clinic by Dr. Tamala Julian. PMH is significant for ASCVD (elevated CAC), DM, HTN. Pt had CT calcium test done on 10/25/21 that showed score of 355 which is the 91st percentile for pt's age, sex, and race. Dr. Tamala Julian saw her on 11/30/21 and discussed score. Pt refused to take a statin since she has tried them in the past and had significant muscle discomfort. Tried to get her on low dose rosuvastatin 5 mg once weekly, but pt refused. ? ?Pt presents today for lipid clinic. ? ?Statins she has tried? Crestor, Lipitor -> arthritis in hands/knees and could not walk across the room ?Would prefer repatha - husband is on it and has zero side effects on it ?Zetia - HR >100 ?PCSK9i?  ? ? ?Current Medications: None ?Intolerances: statins - myalgias, HA; ezetimibe - palpitations; colesevelam - abdominal pain ?Risk Factors: ASCVD, HTN, DM, family hx of death from ruptured abdominal aortic aneurysm ? ?LDL goal: <70 mg/dL ? ?Labs (04/11/21):  TC 226, HDL 51, LDL 114 04/11/21, TG 335 10/04/21 ? ?Diet: A lot of vegetables, chicken; breakfast - eggs, avocado toast; cuts down on carbs; does not eat a lot of beef ? ?Exercise: Could use improvement; trying to get up to 5x per week (3x per week currently) ? ?Family History: Father - DM, HTN ? ?Social History: Denies illicit drug and alcohol use. Used to smoke, quit 35 years ago. ? ?Past Medical History:  ?Diagnosis Date  ? Arthritis   ? Breast cancer ([place])   ? left  ? Cancer [facility]) 1999  ? breast- left  ? Diabetes mellitus   ? Diabetic nephropathy ([place])   ? Fatty liver   ? Hemorrhoids   ? History of ovarian cyst   ? Hyperlipidemia   ? Hypertension   ? Hypothyroidism   ? Melanoma ([place]) 2018  ? right arm  ? Personal history of radiation therapy   ? PONV (postoperative nausea and vomiting)   ? Rosacea   ? Sigmoid diverticulosis   ?  Vaginal delivery   ? x2  ? Ventral hernia   ? ? ?Current Outpatient Medications on File Prior to Visit  ?Medication Sig Dispense Refill  ? acyclovir cream (ZOVIRAX) 5 % as needed.    ? Aspirin-Caffeine (BC FAST PAIN RELIEF PO) Take 1 packet by mouth daily as needed (pain).    ? Cholecalciferol (VITAMIN D3) 5000 units CAPS Take 5,000 Units by mouth at bedtime.     ? clobetasol (TEMOVATE) 0.05 % external solution Apply 1 application daily as needed topically (for scalp).    ? clotrimazole-betamethasone (LOTRISONE) cream APPLY TO THE AFFECTED AND SURROUNDING AREAS OF SKIN TOPICALLY 2 TIMES PER DAY IN THE MORNING AND EVENING FOR 2 WEEKS 45 g 3  ? glucose blood test strip Use 1 to check blood glucose once daily. 450 each 0  ? ibuprofen (ADVIL,MOTRIN) 200 MG tablet Take 400 mg every 6 (six) hours as needed by mouth for headache or moderate pain.     ? levothyroxine (SYNTHROID, LEVOTHROID) 75 MCG tablet Take 75 mcg daily before breakfast by mouth.    ? Multiple Vitamin (MULTIVITAMIN PO) Take 1 tablet by  mouth daily.      ? naproxen sodium (ALEVE) 220 MG tablet Take 220 mg 2 (two) times daily as needed by mouth (for pain/headaches).    ? nebivolol (BYSTOLIC) 5 MG tablet Take 5 mg by mouth daily.    ? Semaglutide,0.25 or 0.'5MG'$ /DOS, (OZEMPIC, 0.25 OR 0.5 MG/DOSE,) 2 MG/1.5ML SOPN Inject 0.25 mg into the skin once a week. 1.5 mL 1  ? TURMERIC PO Take 1,000 mg by mouth at bedtime.    ? ?No current facility-administered medications on file prior to visit.  ? ?Allergies  ?Allergen Reactions  ? Anesthetics, Amide Nausea And Vomiting  ? Colesevelam   ?  Other reaction(s): Abdominal Pain  ? Linagliptin   ?  Other reaction(s): Headache, headache and arthralgias but tolerates other dpp4 inh  ? Statins   ?  Myalgia, Headache  ? Sulfacetamide Sodium   ?  Other reaction(s): Unknown  ? Ezetimibe Palpitations  ?  Other reaction(s): tachycardia  ? ?Assessment/Plan: ? ?1. Hyperlipidemia -  ? ?Patient seen with Debria Garret, [place] pharmacy  student ? ?

## 2021-12-25 NOTE — Patient Instructions (Addendum)
Your LDL goal is < 70 mg/dL ? ?Will send in prior auth for Repatha and will call you if it is approved. Can start Repatha before or after your upcoming surgery. ? ?Call if you experience any side effects such as injection site reaction, muscle weakness or pain ? ?Will check labs on 02/19/22 - 8 weeks after starting medication ?

## 2021-12-26 ENCOUNTER — Telehealth: Payer: Self-pay | Admitting: Student-PharmD

## 2021-12-26 NOTE — Telephone Encounter (Signed)
Repatha PA approved. Sent patient MyChart message yesterday but she has not yet read it. Tried to reach patient by phone to provide this information but was unable to reach. LVM.  ?

## 2021-12-31 ENCOUNTER — Encounter (HOSPITAL_COMMUNITY): Payer: PPO

## 2022-01-03 ENCOUNTER — Ambulatory Visit: Payer: Self-pay | Admitting: Surgery

## 2022-01-03 NOTE — Progress Notes (Signed)
Sent message, via epic in basket, requesting orders in epic from surgeon.  

## 2022-01-10 ENCOUNTER — Inpatient Hospital Stay (HOSPITAL_COMMUNITY)
Admission: RE | Admit: 2022-01-10 | Discharge: 2022-01-10 | Disposition: A | Discharge status: Home / Self Care | Payer: PPO | Source: Ambulatory Visit

## 2022-01-16 NOTE — Progress Notes (Addendum)
COVID Vaccine Completed: no ? ?Date of COVID positive in last 90 days: no ? ?PCP - Thedora Hinders, PA ?Cardiologist - Daneen Schick, MD ? ?Chest x-ray - n/a  ?EKG - 11/29/21 Epic ?Stress Test - n/a ?ECHO - 3 years ago per pt ?Cardiac Cath - n/a ?Pacemaker/ICD device last checked: n/a ?Spinal Cord Stimulator: n/a ? ?Bowel Prep - no ? ?Sleep Study - n/a ?CPAP -  ? ?Fasting Blood Sugar - 130-140 ?Checks Blood Sugar once a week ? ?Blood Thinner Instructions: n/a ?Aspirin Instructions: ?Last Dose: ? ?Activity level: Can go up a flight of stairs and perform activities of daily living without stopping and without symptoms of chest pain or shortness of breath. ?   ?Anesthesia review: DM, HTN, fatty liver, PONV, CAD ? ?Patient denies shortness of breath, fever, cough and chest pain at PAT appointment ? ? ?Patient verbalized understanding of instructions that were given to them at the PAT appointment. Patient was also instructed that they will need to review over the PAT instructions again at home before surgery.  ?

## 2022-01-16 NOTE — Patient Instructions (Addendum)
DUE TO COVID-19 ONLY TWO VISITORS  (aged 69 and older)  ARE ALLOWED TO COME WITH YOU AND STAY IN THE WAITING ROOM ONLY DURING PRE OP AND PROCEDURE.   ?**NO VISITORS ARE ALLOWED IN THE SHORT STAY AREA OR RECOVERY ROOM!!** ? ?IF YOU WILL BE ADMITTED INTO THE HOSPITAL YOU ARE ALLOWED ONLY FOUR SUPPORT PEOPLE DURING VISITATION HOURS ONLY (7 AM -8PM)   ?The support person(s) must pass our screening, gel in and out, and wear a mask at all times, including in the patient?s room. ?Patients must also wear a mask when staff or their support person are in the room. ?Visitors GUEST BADGE MUST BE WORN VISIBLY  ?One adult visitor may remain with you overnight and MUST be in the room by 8 P.M. ?  ? ? Your procedure is scheduled on: 01/23/22 ? ? Report to [facility] Main Entrance ? ?  Report to admitting at 6:15 AM ? ? Call this number if you have problems the morning of surgery [phone] ? ? Do not eat food :After Midnight. ? ? After Midnight you may have the following liquids until 5:30 AM DAY OF SURGERY ? ?Water ?Black Coffee (sugar ok, NO MILK/CREAM OR CREAMERS)  ?Tea (sugar ok, NO MILK/CREAM OR CREAMERS) regular and decaf                             ?Plain Jell-O (NO RED)                                           ?Fruit ices (not with fruit pulp, NO RED)                                     ?Popsicles (NO RED)                                                                  ?Juice: apple, WHITE grape, WHITE cranberry ?Sports drinks like Gatorade (NO RED) ?Clear broth(vegetable,chicken,beef) ? ?FOLLOW BOWEL PREP AND ANY ADDITIONAL PRE OP INSTRUCTIONS YOU RECEIVED FROM YOUR SURGEON'S OFFICE!!! ?  ?  ?Oral Hygiene is also important to reduce your risk of infection.                                    ?Remember - BRUSH YOUR TEETH THE MORNING OF SURGERY WITH YOUR REGULAR TOOTHPASTE ? ? Take these medicines the morning of surgery with A SIP OF WATER: Levothyroxine, Nebivolol.  ? ?DO NOT TAKE ANY ORAL DIABETIC MEDICATIONS  DAY OF YOUR SURGERY ? ?How to Manage Your Diabetes ?Before and After Surgery ? ?Why is it important to control my blood sugar before and after surgery? ?Improving blood sugar levels before and after surgery helps healing and can limit problems. ?A way of improving blood sugar control is eating a healthy diet by: ? Eating less sugar and carbohydrates ? Increasing activity/exercise ? Talking with your doctor about reaching your blood sugar goals ?  High blood sugars (greater than 180 mg/dL) can raise your risk of infections and slow your recovery, so you will need to focus on controlling your diabetes during the weeks before surgery. ?Make sure that the doctor who takes care of your diabetes knows about your planned surgery including the date and location. ? ?How do I manage my blood sugar before surgery? ?Check your blood sugar at least 4 times a day, starting 2 days before surgery, to make sure that the level is not too high or low. ?Check your blood sugar the morning of your surgery when you wake up and every 2 hours until you get to the Short Stay unit. ?If your blood sugar is less than 70 mg/dL, you will need to treat for low blood sugar: ?Do not take insulin. ?Treat a low blood sugar (less than 70 mg/dL) with ? cup of clear juice (cranberry or apple), 4 glucose tablets, OR glucose gel. ?Recheck blood sugar in 15 minutes after treatment (to make sure it is greater than 70 mg/dL). If your blood sugar is not greater than 70 mg/dL on recheck, call [phone] for further instructions. ?Report your blood sugar to the short stay nurse when you get to Short Stay. ? ?If you are admitted to the hospital after surgery: ?Your blood sugar will be checked by the staff and you will probably be given insulin after surgery (instead of oral diabetes medicines) to make sure you have good blood sugar levels. ?The goal for blood sugar control after surgery is 80-180 mg/dL. ?   ?Reviewed and Endorsed by [facility] Patient Education  Committee, August 2015  ?                  ?           You may not have any metal on your body including hair pins, jewelry, and body piercing ? ?           Do not wear make-up, lotions, powders, perfumes, or deodorant ? ?Do not wear nail polish including gel and S&S, artificial/acrylic nails, or any other type of covering on natural nails including finger and toenails. If you have artificial nails, gel coating, etc. that needs to be removed by a nail salon please have this removed prior to surgery or surgery may need to be canceled/ delayed if the surgeon/ anesthesia feels like they are unable to be safely monitored.  ? ?Do not shave  48 hours prior to surgery.  ? ? Do not bring valuables to the hospital. [facility] NOT ?            RESPONSIBLE   FOR VALUABLES. ? ? Contacts, dentures or bridgework may not be worn into surgery. ? ? Bring small overnight bag day of surgery. ?  ?            Please read over the following fact sheets you were given: IF [place] [phone]- Apolonio Schneiders ? ?   [facility] - Preparing for Surgery ?Before surgery, you can play an important role.  Because skin is not sterile, your skin needs to be as free of germs as possible.  You can reduce the number of germs on your skin by washing with CHG (chlorahexidine gluconate) soap before surgery.  CHG is an antiseptic cleaner which kills germs and bonds with the skin to continue killing germs even after washing. ?Please DO NOT use if you have an allergy to  CHG or antibacterial soaps.  If your skin becomes reddened/irritated stop using the CHG and inform your nurse when you arrive at Short Stay. ?Do not shave (including legs and underarms) for at least 48 hours prior to the first CHG shower.  You may shave your face/neck. ? ?Please follow these instructions carefully: ? 1.  Shower with CHG Soap the night before surgery and the  morning of surgery. ? 2.  If you choose to wash your hair, wash  your hair first as usual with your normal  shampoo. ? 3.  After you shampoo, rinse your hair and body thoroughly to remove the shampoo.                            ? 4.  Use CHG as you would any other liquid soap.  You can apply chg directly to the skin and wash.  Gently with a scrungie or clean washcloth. ? 5.  Apply the CHG Soap to your body ONLY FROM THE NECK DOWN.   Do   not use on face/ open      ?                     Wound or open sores. Avoid contact with eyes, ears mouth and   genitals (private parts).  ?                     Production manager,  Genitals (private parts) with your normal soap. ?            6.  Wash thoroughly, paying special attention to the area where your    surgery  will be performed. ? 7.  Thoroughly rinse your body with warm water from the neck down. ? 8.  DO NOT shower/wash with your normal soap after using and rinsing off the CHG Soap. ?               9.  Pat yourself dry with a clean towel. ?           10.  Wear clean pajamas. ?           11.  Place clean sheets on your bed the night of your first shower and do not  sleep with pets. ?Day of Surgery : ?Do not apply any lotions/deodorants the morning of surgery.  Please wear clean clothes to the hospital/surgery center. ? ?FAILURE TO FOLLOW THESE INSTRUCTIONS MAY RESULT IN THE CANCELLATION OF YOUR SURGERY ? ?PATIENT SIGNATURE_________________________________ ? ?NURSE SIGNATURE__________________________________ ? ?________________________________________________________________________  ?

## 2022-01-17 ENCOUNTER — Encounter (HOSPITAL_COMMUNITY)
Admission: RE | Admit: 2022-01-17 | Discharge: 2022-01-17 | Disposition: A | Discharge status: Home / Self Care | Payer: PPO | Source: Ambulatory Visit | Attending: Surgery | Admitting: Surgery

## 2022-01-17 ENCOUNTER — Encounter (HOSPITAL_COMMUNITY): Payer: Self-pay

## 2022-01-17 VITALS — BP 152/84 | HR 73 | Temp 98.3°F | Resp 16 | Ht 63.0 in | Wt 189.8 lb

## 2022-01-17 DIAGNOSIS — Z01812 Encounter for preprocedural laboratory examination: Secondary | ICD-10-CM | POA: Diagnosis present

## 2022-01-17 DIAGNOSIS — E119 Type 2 diabetes mellitus without complications: Secondary | ICD-10-CM | POA: Insufficient documentation

## 2022-01-17 DIAGNOSIS — I2510 Atherosclerotic heart disease of native coronary artery without angina pectoris: Secondary | ICD-10-CM | POA: Insufficient documentation

## 2022-01-17 LAB — COMPREHENSIVE METABOLIC PANEL
ALT: 35 U/L (ref 0–44)
AST: 32 U/L (ref 15–41)
Albumin: 4.2 g/dL (ref 3.5–5.0)
Alkaline Phosphatase: 89 U/L (ref 38–126)
Anion gap: 10 (ref 5–15)
BUN: 21 mg/dL (ref 8–23)
CO2: 26 mmol/L (ref 22–32)
Calcium: 9.7 mg/dL (ref 8.9–10.3)
Chloride: 104 mmol/L (ref 98–111)
Creatinine, Ser: 0.75 mg/dL (ref 0.44–1.00)
GFR, Estimated: >60 mL/min (ref >60)
Glucose, Bld: 128 mg/dL — ABNORMAL HIGH (ref 70–99)
Potassium: 4.3 mmol/L (ref 3.5–5.1)
Sodium: 140 mmol/L (ref 135–145)
Total Bilirubin: 0.6 mg/dL (ref 0.3–1.2)
Total Protein: 7.4 g/dL (ref 6.5–8.1)

## 2022-01-17 LAB — CBC
HCT: 44.5 % (ref 36.0–46.0)
Hemoglobin: 15.4 g/dL — ABNORMAL HIGH (ref 12.0–15.0)
MCH: 31.9 pg (ref 26.0–34.0)
MCHC: 34.6 g/dL (ref 30.0–36.0)
MCV: 92.1 fL (ref 80.0–100.0)
Platelets: 216 10*3/uL (ref 150–400)
RBC: 4.83 MIL/uL (ref 3.87–5.11)
RDW: 12 % (ref 11.5–15.5)
WBC: 7.3 10*3/uL (ref 4.0–10.5)
nRBC: 0 % (ref 0.0–0.2)

## 2022-01-17 LAB — HEMOGLOBIN A1C
Hgb A1c MFr Bld: 7.7 % — ABNORMAL HIGH (ref 4.8–5.6)
Mean Plasma Glucose: 174.29 mg/dL

## 2022-01-17 LAB — GLUCOSE, CAPILLARY: Glucose-Capillary: 136 mg/dL — ABNORMAL HIGH (ref 70–99)

## 2022-01-22 NOTE — Anesthesia Preprocedure Evaluation (Addendum)
Anesthesia Evaluation  ?Patient identified by MRN, date of birth, ID band ?Patient awake ? ? ? ?Reviewed: ?Allergy & Precautions, NPO status , Patient's Chart, lab work & pertinent test results, reviewed documented beta blocker date and time  ? ?History of Anesthesia Complications ?(+) PONV ? ?Airway ?Mallampati: II ? ?TM Distance: >3 FB ?Neck ROM: Full ? ? ? Dental ? ?(+) Dental Advisory Given, Caps ?  ?Pulmonary ?former smoker,  ?  ?breath sounds clear to auscultation ? ? ? ? ? ? Cardiovascular ?hypertension, Pt. on medications and Pt. on home beta blockers ?(-) angina ?Rhythm:Regular Rate:Normal ? ? ?  ?Neuro/Psych ?negative neurological ROS ?   ? GI/Hepatic ?negative GI ROS, Neg liver ROS,   ?Endo/Other  ?diabetes (Ozempic, glu 169)Hypothyroidism obese ? Renal/GU ?Renal InsufficiencyRenal disease  ? ?  ?Musculoskeletal ? ? Abdominal ?(+) + obese,   ?Peds ? Hematology ?  ?Anesthesia Other Findings ? ? Reproductive/Obstetrics ? ?  ? ? ? ? ? ? ? ? ? ? ? ? ? ?  ?  ? ? ? ? ? ? ? ?Anesthesia Physical ?Anesthesia Plan ? ?ASA: 3 ? ?Anesthesia Plan: General  ? ?Post-op Pain Management: Tylenol PO (pre-op)*  ? ?Induction:  ? ?PONV Risk Score and Plan: 4 or greater and Ondansetron, Dexamethasone and Scopolamine patch - Pre-op ? ?Airway Management Planned: Oral ETT ? ?Additional Equipment: None ? ?Intra-op Plan:  ? ?Post-operative Plan: Extubation in OR ? ?Informed Consent: I have reviewed the patients History and Physical, chart, labs and discussed the procedure including the risks, benefits and alternatives for the proposed anesthesia with the patient or authorized representative who has indicated his/her understanding and acceptance.  ? ? ? ?Dental advisory given ? ?Plan Discussed with: CRNA and Surgeon ? ?Anesthesia Plan Comments:   ? ? ? ? ? ?Anesthesia Quick Evaluation ? ?

## 2022-01-23 ENCOUNTER — Other Ambulatory Visit: Payer: Self-pay

## 2022-01-23 ENCOUNTER — Encounter (HOSPITAL_COMMUNITY)
Admission: RE | Disposition: A | Discharge status: Home / Self Care | Payer: Self-pay | Source: Ambulatory Visit | Attending: Surgery

## 2022-01-23 ENCOUNTER — Ambulatory Visit (HOSPITAL_BASED_OUTPATIENT_CLINIC_OR_DEPARTMENT_OTHER): Payer: PPO | Admitting: Certified Registered Nurse Anesthetist

## 2022-01-23 ENCOUNTER — Encounter (HOSPITAL_COMMUNITY): Payer: Self-pay | Admitting: Surgery

## 2022-01-23 ENCOUNTER — Observation Stay (HOSPITAL_COMMUNITY)
Admission: RE | Admit: 2022-01-23 | Discharge: 2022-01-24 | Disposition: A | Discharge status: Home / Self Care | Payer: PPO | Source: Ambulatory Visit | Attending: Surgery | Admitting: Surgery

## 2022-01-23 ENCOUNTER — Ambulatory Visit (HOSPITAL_COMMUNITY): Payer: PPO | Admitting: Physician Assistant

## 2022-01-23 DIAGNOSIS — K432 Incisional hernia without obstruction or gangrene: Secondary | ICD-10-CM

## 2022-01-23 DIAGNOSIS — Z79899 Other long term (current) drug therapy: Secondary | ICD-10-CM | POA: Insufficient documentation

## 2022-01-23 DIAGNOSIS — E119 Type 2 diabetes mellitus without complications: Secondary | ICD-10-CM | POA: Diagnosis not present

## 2022-01-23 DIAGNOSIS — E039 Hypothyroidism, unspecified: Secondary | ICD-10-CM | POA: Diagnosis not present

## 2022-01-23 DIAGNOSIS — Z7982 Long term (current) use of aspirin: Secondary | ICD-10-CM | POA: Diagnosis not present

## 2022-01-23 DIAGNOSIS — E1140 Type 2 diabetes mellitus with diabetic neuropathy, unspecified: Secondary | ICD-10-CM | POA: Diagnosis not present

## 2022-01-23 DIAGNOSIS — I10 Essential (primary) hypertension: Secondary | ICD-10-CM | POA: Diagnosis not present

## 2022-01-23 DIAGNOSIS — K429 Umbilical hernia without obstruction or gangrene: Secondary | ICD-10-CM | POA: Insufficient documentation

## 2022-01-23 DIAGNOSIS — Z87891 Personal history of nicotine dependence: Secondary | ICD-10-CM | POA: Insufficient documentation

## 2022-01-23 DIAGNOSIS — Z853 Personal history of malignant neoplasm of breast: Secondary | ICD-10-CM | POA: Insufficient documentation

## 2022-01-23 DIAGNOSIS — K439 Ventral hernia without obstruction or gangrene: Secondary | ICD-10-CM | POA: Diagnosis present

## 2022-01-23 HISTORY — PX: XI ROBOTIC ASSISTED VENTRAL HERNIA: SHX6789

## 2022-01-23 LAB — CBC
HCT: 40.3 % (ref 36.0–46.0)
Hemoglobin: 13.6 g/dL (ref 12.0–15.0)
MCH: 31.3 pg (ref 26.0–34.0)
MCHC: 33.7 g/dL (ref 30.0–36.0)
MCV: 92.6 fL (ref 80.0–100.0)
Platelets: 207 10*3/uL (ref 150–400)
RBC: 4.35 MIL/uL (ref 3.87–5.11)
RDW: 12 % (ref 11.5–15.5)
WBC: 14.3 10*3/uL — ABNORMAL HIGH (ref 4.0–10.5)
nRBC: 0 % (ref 0.0–0.2)

## 2022-01-23 LAB — GLUCOSE, CAPILLARY
Glucose-Capillary: 169 mg/dL — ABNORMAL HIGH (ref 70–99)
Glucose-Capillary: 268 mg/dL — ABNORMAL HIGH (ref 70–99)
Glucose-Capillary: 226 mg/dL — ABNORMAL HIGH (ref 70–99)
Glucose-Capillary: 160 mg/dL — ABNORMAL HIGH (ref 70–99)
Glucose-Capillary: 113 mg/dL — ABNORMAL HIGH (ref 70–99)

## 2022-01-23 LAB — CREATININE, SERUM
Creatinine, Ser: 0.74 mg/dL (ref 0.44–1.00)
GFR, Estimated: >60 mL/min (ref >60)

## 2022-01-23 SURGERY — REPAIR, HERNIA, VENTRAL, ROBOT-ASSISTED
Anesthesia: General | Site: Abdomen

## 2022-01-23 MED ORDER — ACETAMINOPHEN 500 MG PO TABS: 1000.0000 mg | ORAL_TABLET | Freq: Once | ORAL | Status: DC

## 2022-01-23 MED ORDER — ONDANSETRON HCL 4 MG/2ML IJ SOLN
INTRAMUSCULAR | Status: DC | PRN
  Administered 2022-01-23: 4 mg via INTRAVENOUS

## 2022-01-23 MED ORDER — CLOBETASOL PROPIONATE 0.05 % EX SOLN: 1.0000 "application " | Freq: Every day | CUTANEOUS | Status: DC | PRN

## 2022-01-23 MED ORDER — INSULIN REGULAR(HUMAN) IN NACL 100-0.9 UT/100ML-% IV SOLN
INTRAVENOUS | Status: DC
  Administered 2022-01-23: 18 [IU]/h via INTRAVENOUS
  Filled 2022-01-23: qty 100

## 2022-01-23 MED ORDER — DEXAMETHASONE SODIUM PHOSPHATE 10 MG/ML IJ SOLN
INTRAMUSCULAR | Status: AC
  Filled 2022-01-23: qty 1

## 2022-01-23 MED ORDER — PROPOFOL 10 MG/ML IV BOLUS
INTRAVENOUS | Status: AC
  Filled 2022-01-23: qty 20

## 2022-01-23 MED ORDER — PROPOFOL 500 MG/50ML IV EMUL
INTRAVENOUS | Status: AC
  Filled 2022-01-23: qty 50

## 2022-01-23 MED ORDER — PHENYLEPHRINE 80 MCG/ML (10ML) SYRINGE FOR IV PUSH (FOR BLOOD PRESSURE SUPPORT)
PREFILLED_SYRINGE | INTRAVENOUS | Status: DC | PRN
  Administered 2022-01-23 (×6): 80 ug via INTRAVENOUS

## 2022-01-23 MED ORDER — BUPIVACAINE LIPOSOME 1.3 % IJ SUSP
INTRAMUSCULAR | Status: DC | PRN
  Administered 2022-01-23: 20 mL

## 2022-01-23 MED ORDER — LEVOTHYROXINE SODIUM 75 MCG PO TABS
75.0000 ug | ORAL_TABLET | Freq: Every day | ORAL | Status: DC
  Administered 2022-01-24: 75 ug via ORAL
  Filled 2022-01-23: qty 1

## 2022-01-23 MED ORDER — GABAPENTIN 300 MG PO CAPS
300.0000 mg | ORAL_CAPSULE | ORAL | Status: AC
  Administered 2022-01-23: 300 mg via ORAL
  Filled 2022-01-23: qty 1

## 2022-01-23 MED ORDER — 0.9 % SODIUM CHLORIDE (POUR BTL) OPTIME
TOPICAL | Status: DC | PRN
  Administered 2022-01-23: 1000 mL

## 2022-01-23 MED ORDER — OXYCODONE HCL 5 MG/5ML PO SOLN: 5.0000 mg | Freq: Once | ORAL | Status: DC | PRN

## 2022-01-23 MED ORDER — LIDOCAINE HCL (CARDIAC) PF 100 MG/5ML IV SOSY
PREFILLED_SYRINGE | INTRAVENOUS | Status: DC | PRN
  Administered 2022-01-23: 80 mg via INTRAVENOUS

## 2022-01-23 MED ORDER — PHENYLEPHRINE HCL (PRESSORS) 10 MG/ML IV SOLN
INTRAVENOUS | Status: AC
  Filled 2022-01-23: qty 1

## 2022-01-23 MED ORDER — ONDANSETRON HCL 4 MG/2ML IJ SOLN
INTRAMUSCULAR | Status: AC
  Filled 2022-01-23: qty 2

## 2022-01-23 MED ORDER — CEFAZOLIN SODIUM-DEXTROSE 2-4 GM/100ML-% IV SOLN
INTRAVENOUS | Status: AC
  Filled 2022-01-23: qty 100

## 2022-01-23 MED ORDER — MIDAZOLAM HCL 2 MG/2ML IJ SOLN: 0.5000 mg | Freq: Once | INTRAMUSCULAR | Status: DC | PRN

## 2022-01-23 MED ORDER — KETOROLAC TROMETHAMINE 15 MG/ML IJ SOLN
15.0000 mg | Freq: Three times a day (TID) | INTRAMUSCULAR | Status: DC
Start: 2022-01-23 — End: 2022-01-24
  Administered 2022-01-23 – 2022-01-24 (×2): 15 mg via INTRAVENOUS
  Filled 2022-01-23 (×2): qty 1

## 2022-01-23 MED ORDER — DEXAMETHASONE SODIUM PHOSPHATE 10 MG/ML IJ SOLN
INTRAMUSCULAR | Status: DC | PRN
  Administered 2022-01-23: 5 mg via INTRAVENOUS

## 2022-01-23 MED ORDER — DOCUSATE SODIUM 100 MG PO CAPS
100.0000 mg | ORAL_CAPSULE | Freq: Two times a day (BID) | ORAL | Status: DC
  Administered 2022-01-23 – 2022-01-24 (×2): 100 mg via ORAL
  Filled 2022-01-23 (×2): qty 1

## 2022-01-23 MED ORDER — PROCHLORPERAZINE EDISYLATE 10 MG/2ML IJ SOLN
10.0000 mg | INTRAMUSCULAR | Status: DC | PRN
Start: 2022-01-23 — End: 2022-01-24

## 2022-01-23 MED ORDER — PROPOFOL 500 MG/50ML IV EMUL
INTRAVENOUS | Status: DC | PRN
  Administered 2022-01-23: 25 ug/kg/min via INTRAVENOUS

## 2022-01-23 MED ORDER — BUPIVACAINE LIPOSOME 1.3 % IJ SUSP
INTRAMUSCULAR | Status: AC
  Filled 2022-01-23: qty 20

## 2022-01-23 MED ORDER — OXYCODONE HCL 5 MG PO TABS: 5.0000 mg | ORAL_TABLET | Freq: Once | ORAL | Status: DC | PRN

## 2022-01-23 MED ORDER — SIMETHICONE 80 MG PO CHEW: 80.0000 mg | CHEWABLE_TABLET | Freq: Four times a day (QID) | ORAL | Status: DC | PRN

## 2022-01-23 MED ORDER — NAPROXEN SODIUM 275 MG PO TABS
275.0000 mg | ORAL_TABLET | Freq: Two times a day (BID) | ORAL | Status: DC | PRN
  Filled 2022-01-23: qty 1

## 2022-01-23 MED ORDER — ENOXAPARIN SODIUM 40 MG/0.4ML IJ SOSY
40.0000 mg | PREFILLED_SYRINGE | INTRAMUSCULAR | Status: DC
  Administered 2022-01-24: 40 mg via SUBCUTANEOUS
  Filled 2022-01-23: qty 0.4

## 2022-01-23 MED ORDER — LACTATED RINGERS IV SOLN: INTRAVENOUS | Status: DC

## 2022-01-23 MED ORDER — PROPOFOL 10 MG/ML IV BOLUS
INTRAVENOUS | Status: DC | PRN
  Administered 2022-01-23: 130 mg via INTRAVENOUS

## 2022-01-23 MED ORDER — BUPIVACAINE-EPINEPHRINE (PF) 0.25% -1:200000 IJ SOLN
INTRAMUSCULAR | Status: AC
  Filled 2022-01-23: qty 30

## 2022-01-23 MED ORDER — OXYCODONE HCL 5 MG PO TABS: 5.0000 mg | ORAL_TABLET | ORAL | Status: DC | PRN

## 2022-01-23 MED ORDER — PHENYLEPHRINE 80 MCG/ML (10ML) SYRINGE FOR IV PUSH (FOR BLOOD PRESSURE SUPPORT)
PREFILLED_SYRINGE | INTRAVENOUS | Status: AC
  Filled 2022-01-23: qty 10

## 2022-01-23 MED ORDER — ONDANSETRON HCL 4 MG/2ML IJ SOLN: 4.0000 mg | Freq: Four times a day (QID) | INTRAMUSCULAR | Status: DC | PRN

## 2022-01-23 MED ORDER — DIPHENHYDRAMINE HCL 50 MG/ML IJ SOLN
INTRAMUSCULAR | Status: AC
  Filled 2022-01-23: qty 1

## 2022-01-23 MED ORDER — ORAL CARE MOUTH RINSE: 15.0000 mL | Freq: Once | OROMUCOSAL | Status: AC

## 2022-01-23 MED ORDER — ENOXAPARIN SODIUM 40 MG/0.4ML IJ SOSY
40.0000 mg | PREFILLED_SYRINGE | Freq: Once | INTRAMUSCULAR | Status: AC
  Administered 2022-01-23: 40 mg via SUBCUTANEOUS
  Filled 2022-01-23: qty 0.4

## 2022-01-23 MED ORDER — CLOTRIMAZOLE 1 % EX CREA
TOPICAL_CREAM | Freq: Two times a day (BID) | CUTANEOUS | Status: DC
Start: 2022-01-23 — End: 2022-01-23

## 2022-01-23 MED ORDER — DIPHENHYDRAMINE HCL 50 MG/ML IJ SOLN
INTRAMUSCULAR | Status: DC | PRN
  Administered 2022-01-23: 12.5 mg via INTRAVENOUS

## 2022-01-23 MED ORDER — CHLORHEXIDINE GLUCONATE CLOTH 2 % EX PADS: 6.0000 | MEDICATED_PAD | Freq: Once | CUTANEOUS | Status: DC

## 2022-01-23 MED ORDER — ACETAMINOPHEN 325 MG PO TABS
650.0000 mg | ORAL_TABLET | Freq: Four times a day (QID) | ORAL | Status: DC
  Administered 2022-01-23 – 2022-01-24 (×4): 650 mg via ORAL
  Filled 2022-01-23 (×4): qty 2

## 2022-01-23 MED ORDER — ROCURONIUM BROMIDE 10 MG/ML (PF) SYRINGE
PREFILLED_SYRINGE | INTRAVENOUS | Status: DC | PRN
Start: 2022-01-23 — End: 2022-01-23
  Administered 2022-01-23: 20 mg via INTRAVENOUS
  Administered 2022-01-23: 60 mg via INTRAVENOUS
  Administered 2022-01-23 (×3): 20 mg via INTRAVENOUS
  Administered 2022-01-23: 40 mg via INTRAVENOUS

## 2022-01-23 MED ORDER — FENTANYL CITRATE (PF) 250 MCG/5ML IJ SOLN
INTRAMUSCULAR | Status: AC
  Filled 2022-01-23: qty 5

## 2022-01-23 MED ORDER — GABAPENTIN 300 MG PO CAPS
300.0000 mg | ORAL_CAPSULE | Freq: Three times a day (TID) | ORAL | Status: DC
  Filled 2022-01-23 (×3): qty 1

## 2022-01-23 MED ORDER — FENTANYL CITRATE (PF) 250 MCG/5ML IJ SOLN
INTRAMUSCULAR | Status: DC | PRN
  Administered 2022-01-23: 100 ug via INTRAVENOUS
  Administered 2022-01-23 (×2): 50 ug via INTRAVENOUS

## 2022-01-23 MED ORDER — ROCURONIUM BROMIDE 10 MG/ML (PF) SYRINGE
PREFILLED_SYRINGE | INTRAVENOUS | Status: AC
  Filled 2022-01-23: qty 10

## 2022-01-23 MED ORDER — BUPIVACAINE-EPINEPHRINE 0.25% -1:200000 IJ SOLN
INTRAMUSCULAR | Status: DC | PRN
  Administered 2022-01-23: 30 mL

## 2022-01-23 MED ORDER — MIDAZOLAM HCL 2 MG/2ML IJ SOLN
INTRAMUSCULAR | Status: AC
  Filled 2022-01-23: qty 2

## 2022-01-23 MED ORDER — SCOPOLAMINE 1 MG/3DAYS TD PT72
1.0000 | MEDICATED_PATCH | TRANSDERMAL | Status: DC
  Administered 2022-01-23: 1.5 mg via TRANSDERMAL
  Filled 2022-01-23: qty 1

## 2022-01-23 MED ORDER — NEBIVOLOL HCL 5 MG PO TABS
5.0000 mg | ORAL_TABLET | Freq: Every day | ORAL | Status: DC
  Administered 2022-01-24: 5 mg via ORAL
  Filled 2022-01-23: qty 1

## 2022-01-23 MED ORDER — ACETAMINOPHEN 500 MG PO TABS
1000.0000 mg | ORAL_TABLET | ORAL | Status: AC
  Administered 2022-01-23: 1000 mg via ORAL
  Filled 2022-01-23: qty 2

## 2022-01-23 MED ORDER — CEFAZOLIN SODIUM-DEXTROSE 2-4 GM/100ML-% IV SOLN
2.0000 g | INTRAVENOUS | Status: AC
  Administered 2022-01-23 (×2): 2 g via INTRAVENOUS
  Filled 2022-01-23: qty 100

## 2022-01-23 MED ORDER — PHENYLEPHRINE HCL-NACL 20-0.9 MG/250ML-% IV SOLN
INTRAVENOUS | Status: DC | PRN
  Administered 2022-01-23: 20 ug/min via INTRAVENOUS

## 2022-01-23 MED ORDER — METHOCARBAMOL 500 MG IVPB - SIMPLE MED
500.0000 mg | Freq: Four times a day (QID) | INTRAVENOUS | Status: DC | PRN
Start: 2022-01-23 — End: 2022-01-24
  Filled 2022-01-23: qty 50

## 2022-01-23 MED ORDER — MEPERIDINE HCL 50 MG/ML IJ SOLN: 6.2500 mg | INTRAMUSCULAR | Status: DC | PRN

## 2022-01-23 MED ORDER — CHLORHEXIDINE GLUCONATE 0.12 % MT SOLN
15.0000 mL | Freq: Once | OROMUCOSAL | Status: AC
  Administered 2022-01-23: 15 mL via OROMUCOSAL

## 2022-01-23 MED ORDER — OXYCODONE HCL 5 MG PO TABS: 10.0000 mg | ORAL_TABLET | ORAL | Status: DC | PRN

## 2022-01-23 MED ORDER — HYDROMORPHONE HCL 1 MG/ML IJ SOLN: 0.2500 mg | INTRAMUSCULAR | Status: DC | PRN

## 2022-01-23 MED ORDER — LIDOCAINE HCL (PF) 2 % IJ SOLN
INTRAMUSCULAR | Status: AC
  Filled 2022-01-23: qty 5

## 2022-01-23 MED ORDER — HYDROMORPHONE HCL 1 MG/ML IJ SOLN: 0.5000 mg | INTRAMUSCULAR | Status: DC | PRN

## 2022-01-23 MED ORDER — MIDAZOLAM HCL 2 MG/2ML IJ SOLN
INTRAMUSCULAR | Status: DC | PRN
  Administered 2022-01-23: 2 mg via INTRAVENOUS

## 2022-01-23 MED ORDER — SUGAMMADEX SODIUM 200 MG/2ML IV SOLN
INTRAVENOUS | Status: DC | PRN
  Administered 2022-01-23: 200 mg via INTRAVENOUS

## 2022-01-23 SURGICAL SUPPLY — 68 items
BAG COUNTER SPONGE SURGICOUNT (BAG) ×1 IMPLANT
BLADE SURG SZ11 CARB STEEL (BLADE) ×2 IMPLANT
CHLORAPREP W/TINT 26 (MISCELLANEOUS) ×2 IMPLANT
COVER SURGICAL LIGHT HANDLE (MISCELLANEOUS) ×1 IMPLANT
COVER TIP SHEARS 8 DVNC (MISCELLANEOUS) ×1 IMPLANT
COVER TIP SHEARS 8MM DA VINCI (MISCELLANEOUS) ×2
DERMABOND ADVANCED (GAUZE/BANDAGES/DRESSINGS) ×1
DERMABOND ADVANCED .7 DNX12 (GAUZE/BANDAGES/DRESSINGS) IMPLANT
DEVICE TROCAR PUNCTURE CLOSURE (ENDOMECHANICALS) IMPLANT
DRAPE ARM DVNC X/XI (DISPOSABLE) ×4 IMPLANT
DRAPE COLUMN DVNC XI (DISPOSABLE) ×1 IMPLANT
DRAPE DA VINCI XI ARM (DISPOSABLE) ×8
DRAPE DA VINCI XI COLUMN (DISPOSABLE) ×2
ELECT L-HOOK LAP 45CM DISP (ELECTROSURGICAL) ×2
ELECT PENCIL ROCKER SW 15FT (MISCELLANEOUS) ×2 IMPLANT
ELECT REM PT RETURN 15FT ADLT (MISCELLANEOUS) ×2 IMPLANT
ELECTRODE L-HOOK LAP 45CM DISP (ELECTROSURGICAL) ×1 IMPLANT
GLOVE BIO SURGEON STRL SZ7.5 (GLOVE) ×4 IMPLANT
GLOVE BIOGEL PI IND STRL 8 (GLOVE) ×2 IMPLANT
GLOVE BIOGEL PI INDICATOR 8 (GLOVE) ×2
GOWN STRL REUS W/ TWL XL LVL3 (GOWN DISPOSABLE) ×3 IMPLANT
GOWN STRL REUS W/TWL XL LVL3 (GOWN DISPOSABLE) ×6
GRASPER SUT TROCAR 14GX15 (MISCELLANEOUS) IMPLANT
IRRIG SUCT STRYKERFLOW 2 WTIP (MISCELLANEOUS)
IRRIGATION SUCT STRKRFLW 2 WTP (MISCELLANEOUS) IMPLANT
IRRIGATOR SUCT 8 DISP DVNC XI (IRRIGATION / IRRIGATOR) IMPLANT
IRRIGATOR SUCTION 8MM XI DISP (IRRIGATION / IRRIGATOR) ×2
KIT BASIN OR (CUSTOM PROCEDURE TRAY) ×2 IMPLANT
KIT TURNOVER KIT A (KITS) ×1 IMPLANT
MANIFOLD NEPTUNE II (INSTRUMENTS) ×2 IMPLANT
MESH SOFT 12X12IN BARD (Mesh General) ×1 IMPLANT
MESH VICRYL KNITTED 12X12 (Mesh General) ×1 IMPLANT
NDL SPNL 18GX3.5 QUINCKE PK (NEEDLE) ×1 IMPLANT
NEEDLE SPNL 18GX3.5 QUINCKE PK (NEEDLE) ×2 IMPLANT
PACK CARDIOVASCULAR III (CUSTOM PROCEDURE TRAY) ×2 IMPLANT
SEAL CANN UNIV 5-8 DVNC XI (MISCELLANEOUS) ×3 IMPLANT
SEAL XI 5MM-8MM UNIVERSAL (MISCELLANEOUS) ×6
SEALER VESSEL DA VINCI XI (MISCELLANEOUS)
SEALER VESSEL EXT DVNC XI (MISCELLANEOUS) IMPLANT
SOL ANTI FOG 6CC (MISCELLANEOUS) ×1 IMPLANT
SOLUTION ANTI FOG 6CC (MISCELLANEOUS) ×1
SOLUTION ELECTROLUBE (MISCELLANEOUS) ×2 IMPLANT
SPIKE FLUID TRANSFER (MISCELLANEOUS) ×2 IMPLANT
SUT MNCRL AB 4-0 PS2 18 (SUTURE) ×2 IMPLANT
SUT STRAFIX PDS 18 CTX (SUTURE) IMPLANT
SUT STRAFIX SPIRAL 2-0 3 (SUTURE) IMPLANT
SUT STRAFIX SPIRAL 2-0 5 (SUTURE) IMPLANT
SUT STRAFIX SPIRAL 2-0 9 (SUTURE) ×1 IMPLANT
SUT STRAFIX SYMMETRIC 0-0 12 (SUTURE)
SUT STRAFIX SYMMETRIC 0-0 18 (SUTURE)
SUT STRAFIX SYMMETRIC 0-0 24 (SUTURE)
SUT STRAFIX SYMMETRIC 1-0 12 (SUTURE) ×2
SUT STRAFIX SYMMETRIC 1-0 24 (SUTURE)
SUT VIC AB 2-0 SH 27 (SUTURE) ×2
SUT VIC AB 2-0 SH 27X BRD (SUTURE) IMPLANT
SUTURE STRAFIX SYMMETRC 0-0 12 (SUTURE) IMPLANT
SUTURE STRAFIX SYMMETRC 0-0 18 (SUTURE) IMPLANT
SUTURE STRAFIX SYMMETRC 0-0 24 (SUTURE) IMPLANT
SUTURE STRAFIX SYMMETRC 1-0 12 (SUTURE) IMPLANT
SUTURE STRAFIX SYMMETRC 1-0 24 (SUTURE) IMPLANT
SYR 20ML LL LF (SYRINGE) ×2 IMPLANT
TOWEL OR 17X26 10 PK STRL BLUE (TOWEL DISPOSABLE) ×2 IMPLANT
TOWEL OR NON WOVEN STRL DISP B (DISPOSABLE) IMPLANT
TRAY FOLEY MTR SLVR 14FR STAT (SET/KITS/TRAYS/PACK) ×1 IMPLANT
TROCAR BLADELESS OPT 5 100 (ENDOMECHANICALS) ×1 IMPLANT
TROCAR KII 12X100 BLADELESS (ENDOMECHANICALS) ×2 IMPLANT
TROCAR Z-THREAD FIOS 5X100MM (TROCAR) ×2 IMPLANT
TUBING INSUFFLATION 10FT LAP (TUBING) ×2 IMPLANT

## 2022-01-23 NOTE — Anesthesia Postprocedure Evaluation (Signed)
Anesthesia Post Note ? ?Patient: Susan Montes ? ?Procedure(s) Performed: XI ROBOTIC ASSISTED RECURRENT INCISIONAL HERNIA REPAIR WITH MESH (Abdomen) ? ?  ? ?Patient location during evaluation: PACU ?Anesthesia Type: General ?Level of consciousness: awake and alert, patient cooperative and oriented ?Pain management: pain level controlled ?Vital Signs Assessment: post-procedure vital signs reviewed and stable ?Respiratory status: spontaneous breathing, nonlabored ventilation, respiratory function stable and patient connected to nasal cannula oxygen ?Cardiovascular status: blood pressure returned to baseline and stable ?Postop Assessment: no apparent nausea or vomiting ?Anesthetic complications: no ? ? ?No notable events documented. ? ?Last Vitals:  ?Vitals:  ? 01/23/22 1545 01/23/22 1600  ?BP: 121/76 116/75  ?Pulse: 85 84  ?Resp: 16 13  ?Temp:    ?SpO2: 95% 95%  ?  ?Last Pain:  ?Vitals:  ? 01/23/22 1600  ?TempSrc:   ?PainSc: 0-No pain  ? ? ?  ?  ?  ?  ?  ?  ? ?Susan Montes,E. Olegario Emberson ? ? ? ? ?

## 2022-01-23 NOTE — Transfer of Care (Signed)
Immediate Anesthesia Transfer of Care Note ? ?Patient: Susan Montes ? ?Procedure(s) Performed: XI ROBOTIC ASSISTED RECURRENT INCISIONAL HERNIA REPAIR WITH MESH (Abdomen) ? ?Patient Location: PACU ? ?Anesthesia Type:General ? ?Level of Consciousness: drowsy ? ?Airway & Oxygen Therapy: Patient Spontanous Breathing and Patient connected to face mask oxygen ? ?Post-op Assessment: Report given to RN and Post -op Vital signs reviewed and stable ? ?Post vital signs: Reviewed and stable ? ?Last Vitals:  ?Vitals Value Taken Time  ?BP 128/71 01/23/22 1433  ?Temp    ?Pulse 83 01/23/22 1437  ?Resp 12 01/23/22 1437  ?SpO2 97 % 01/23/22 1437  ?Vitals shown include unvalidated device data. ? ?Last Pain:  ?Vitals:  ? 01/23/22 0658  ?TempSrc: Oral  ?PainSc:   ?   ? ?Patients Stated Pain Goal: 3 (01/23/22 9244) ? ?Complications: No notable events documented. ?

## 2022-01-23 NOTE — H&P (Signed)
? ?Admitting Physician: Susan Montes ? ?Service: General surgery ? ?CC: Susan Montes ? ?Subjective  ? ?HPI: ?Susan Montes is an 71 y.o. female who is here for elective Susan Montes repair ? ?Past Medical History:  ?Diagnosis Date  ? Arthritis   ? Breast cancer ([place])   ? left  ? Cancer [facility]) 1999  ? breast- left  ? Diabetes mellitus   ? Diabetic nephropathy ([place])   ? Fatty liver   ? Hemorrhoids   ? History of ovarian cyst   ? Hyperlipidemia   ? Hypertension   ? Hypothyroidism   ? Melanoma ([place]) 2018  ? right arm  ? Personal history of radiation therapy   ? PONV (postoperative nausea and vomiting)   ? Rosacea   ? Sigmoid diverticulosis   ? Vaginal delivery   ? x2  ? Ventral Susan Montes   ? ? ?Past Surgical History:  ?Procedure Laterality Date  ? ABDOMINAL HYSTERECTOMY  2008  ? BREAST LUMPECTOMY Left   ? 1999  ? BREAST SURGERY  1998  ? lumpectomies april and may  ? COLONOSCOPY  2010  ? DIAGNOSTIC LAPAROSCOPY  1988  ? FOOT SURGERY Right   ? HEMANGIOMA EXCISION    ? right  ? Susan Montes REPAIR  2009, 2010  ? LAPAROSCOPIC ABDOMINAL EXPLORATION N/A 12/22/2017  ? Procedure: LAPAROSCOPIC AND OPEN LOWER ABDOMINAL WALL Susan Montes, WITH REMOVAL OF MESH ERAS PATHWAY;  Surgeon: Susan Overall, MD;  Location: [facility];  Service: General;  Laterality: N/A;  INSERTION OF MESH  ? WISDOM TOOTH EXTRACTION    ? ? ?Family History  ?Problem Relation Age of Onset  ? Diabetes Father   ? Hypertension Father   ? Cancer Maternal Grandmother   ?     breast  ? Breast cancer Maternal Grandmother   ? Heart attack Neg Hx   ? Hyperlipidemia Neg Hx   ? ? ?Social:  reports that she quit smoking about 35 years ago. Susan Montes smoking use included cigarettes. She has never used smokeless tobacco. She reports current alcohol use. She reports that she does not use drugs. ? ?Allergies:  ?Allergies  ?Allergen Reactions  ? Anesthetics, Amide Nausea And Vomiting  ? Statins   ?  Myalgia, Headache  ? Sulfacetamide Sodium   ?  Pt unaware of allergy   ? Tradjenta [Linagliptin]   ?   Headache, headache and arthralgias but tolerates other dpp4 inh  ? Welchol [Colesevelam]   ?  Abdominal Pain  ? Zetia [Ezetimibe] Palpitations  ?  Other reaction(s): tachycardia  ? ? ?Medications: ?Current Outpatient Medications  ?Medication Instructions  ? Aspirin-Caffeine (BC FAST PAIN RELIEF PO) 1 packet, Oral, Daily PRN  ? clobetasol (TEMOVATE) 0.05 % external solution 1 application., Topical, Daily PRN  ? clotrimazole-betamethasone (LOTRISONE) cream APPLY TO THE AFFECTED AND SURROUNDING AREAS OF SKIN TOPICALLY 2 TIMES PER DAY IN THE MORNING AND EVENING FOR 2 WEEKS  ? glucose blood test strip Use 1 to check blood glucose once daily.  ? ibuprofen (ADVIL) 400 mg, Oral, Every 6 hours PRN  ? levothyroxine (SYNTHROID) 75 mcg, Oral, Daily before breakfast  ? Multiple Vitamin (MULTIVITAMIN PO) 1 tablet, Oral, Daily  ? naproxen sodium (ALEVE) 220 mg, Oral, 2 times daily PRN  ? nebivolol (BYSTOLIC) 5 mg, Oral, Daily  ? Ozempic (1 MG/DOSE) 1 mg, Subcutaneous, Every Mon  ? Repatha SureClick 353 mg, Subcutaneous, Every 14 days  ? TURMERIC PO 1,000 mg, Oral, Daily at bedtime  ? Vitamin D3 5,000 Units, Oral, Daily  at bedtime  ? ? ?ROS - all of the below systems have been reviewed with the patient and positives are indicated with bold text ?General: chills, fever or night sweats ?Eyes: blurry vision or double vision ?ENT: epistaxis or sore throat ?Allergy/Immunology: itchy/watery eyes or nasal congestion ?Hematologic/Lymphatic: bleeding problems, blood clots or swollen lymph nodes ?Endocrine: temperature intolerance or unexpected weight changes ?Breast: new or changing breast lumps or nipple discharge ?Resp: cough, shortness of breath, or wheezing ?CV: chest pain or dyspnea on exertion ?GI: as per HPI ?GU: dysuria, trouble voiding, or hematuria ?MSK: joint pain or joint stiffness ?Neuro: TIA or stroke symptoms ?Derm: pruritus and skin lesion changes ?Psych: anxiety and depression ? ?Objective  ? ?PE ?Blood pressure (!) 156/84,  pulse 75, temperature 98.9 ?F (37.2 ?C), temperature source Oral, resp. rate 16, height '5\' 3"'$  (1.6 m), weight 86.1 kg, SpO2 96 %. ?General appearance - Consistent with stated age. Normal posture. Voice Normal. ?Mental status - alert and oriented ?Integumentary - No rash or lesion on limited exam ?Head - Normocephalic, atraumatic ?Face - Strength and tone intact ?Eyes - extraocular movement intact, sclera anicteric ?Chest - quiet, even and easy respiratory effort with no use of accessory muscles ?Neurological - able to articulate well with normal speech/language, rate, volume and coherence. ?Mood/affect - normal ?Judgement and insight - insight is appropriate concerning matters relevant to self and the patient displays appropriate judgment regarding every day activities. ?Thought Processes/Cognitive Function - aware of current events. ?Musculoskeletal - strength symmetrical throughout, no deformity ?Abdomen -palpable Susan Montes defect just above the umbilicus. This appears just superior to the skin incision from Susan Montes previous retromuscular Susan Montes repair. There may be a previous port site in this area as well. The fascial defect is broad with a thin subcutaneous fatty layer of tissue and a small amount of Susan Montes contents. ?  ? ?Results for orders placed or performed during the hospital encounter of 01/23/22 (from the past 24 hour(s))  ?Glucose, capillary     Status: Abnormal  ? Collection Time: 01/23/22  6:53 AM  ?Result Value Ref Range  ? Glucose-Capillary 169 (H) 70 - 99 mg/dL  ? Comment 1 Notify RN   ? ? ?Imaging Orders  ?No imaging studies ordered today  ? ?CT 07/30/19: ?1. New small paraumbilical ventral Susan Montes containing only omental fat. ?2. Colonic diverticulosis. No radiographic evidence of diverticulitis. ?3. Stable hepatic steatosis. ?  ?On my review the Susan Montes measures 5.8 cm wide by 3.0 cm tall. ? ?Assessment and Plan  ? ?Diagnoses and all orders for this visit: ? ?Incisional Susan Montes, without obstruction or  gangrene ? ? ? ?Susan Montes has noticed a supraumbilical incisional Susan Montes measuring 5.8 cm wide by 3.0 cm tall on CT scan.  ? ?We discussed the options of surgery versus observation for this Susan Montes. I feel has a broad base and is low risk for developing acute Susan Montes emergency despite the fact that it contains a portion of the transverse colon. She was at first unsure if she wants to proceed with surgery at this time. I recommend she observe the Susan Montes and if she develops worsening symptoms to call to be considered for Susan Montes repair.  She called back into the office and explained Susan Montes symptoms had worsened and she would like the Susan Montes repaired.  We discussed robotic incisional Susan Montes repair with mesh. The procedure itself as well as its risk, benefits, and alternatives were discussed with patient in full.  All questions answered.  The patient granted consent to  proceed.  We will proceed as scheduled. ? ?  ? ?Felicie Morn, MD ? ?[facility] ?Use [email or web address] to contact on call provider ? ? ? ?

## 2022-01-23 NOTE — Anesthesia Procedure Notes (Signed)
Procedure Name: Intubation ?Date/Time: 01/23/2022 8:35 AM ?Performed by: Raenette Rover, CRNA ?Pre-anesthesia Checklist: Patient identified, Emergency Drugs available, Suction available and Patient being monitored ?Patient Re-evaluated:Patient Re-evaluated prior to induction ?Oxygen Delivery Method: Circle system utilized ?Preoxygenation: Pre-oxygenation with 100% oxygen ?Induction Type: IV induction ?Ventilation: Mask ventilation without difficulty ?Laryngoscope Size: Mac and 3 ?Grade View: Grade I ?Tube type: Oral ?Tube size: 7.0 mm ?Number of attempts: 1 ?Airway Equipment and Method: Stylet ?Placement Confirmation: ETT inserted through vocal cords under direct vision, positive ETCO2 and breath sounds checked- equal and bilateral ?Secured at: 22 cm ?Tube secured with: Tape ?Dental Injury: Teeth and Oropharynx as per pre-operative assessment  ? ? ? ? ?

## 2022-01-23 NOTE — Op Note (Signed)
? ?Patient: Susan Montes (09-Apr-1951, [id number]) ? ?Date of Surgery: 01/23/2022  ? ?Preoperative Diagnosis: RECURRENT INCISIONAL HERNIA  ? ?Postoperative Diagnosis: RECURRENT INCISIONAL HERNIA 11cm tall by 7.5 cm wide ? ?Surgical Procedure:  ? ?XI ROBOTIC ASSISTED RECURRENT INCISIONAL HERNIA REPAIR WITH MESH:   ?Bilateral posterior rectus myofascial release ?Right sided transversus abdominis release ?Patch of posterior layer of abdominal wall reconstruction with 8cm x 8cm vicryl mesh patch ?Placement of 30 cm tall by 27 cm wide retromuscular mesh ? ? ?Operative Team Members:  ?Surgeon(s) and Role: ?   * Daionna Crossland, Nickola Major, MD - Primary  ? ?Anesthesiologist: Annye Asa, MD ?CRNA: Raenette Rover, CRNA; Eben Burow, CRNA  ? ?Anesthesia: General  ? ?Fluids:  ?Total I/O ?In: 3200 [I.V.:3000; IV Piggyback:200] ?Out: 220 [Urine:170; Blood:50] ? ?Complications: None ? ?Drains:  None ? ?Specimen: None ? ?Disposition:  PACU - hemodynamically stable. ? ?Plan of Care: Admit for overnight observation ? ?Indications for Procedure: AYLANA HIRSCHFELD is a 71 y.o. female who presented with a recurrent incisional hernia. ? ?Ms. Mcmullen had a hysterectomy in Nov 2008 for a left ovarian mass. She had a rapid presentation of a lower midline abdominal wall hernia. Dr. Lucia Gaskins did a laparoscopic repair of the hernia on 10/06/2007 using a 20 x 25 cm piece of Parietex mesh. That repair failed. Dr. Lucia Gaskins did a second repair - which was a repair of the intact mesh on 09/30/2008. That repair failed. She then underwent recurrent lower abdominal wall hernia repair with removal of old parietex mesh and 30 cm x 30 cm ethicon ultrapro retromuscular repair on 12/22/2017.  She presented with recurrent bulging just above the umbilicus. ? ?I recommended robotic recurrent ventral hernia repair with mesh.  The procedure itself as well as the risks, benefits and alternatives were described.  The risks discussed included but were not limited  to the risk of infection, bleeding, damage to nearby structures, recurrent hernia, chronic pain, and mesh complication requiring removal.  After a full discussion and all questions answered, the patient granted consent to proceed. ? ?Findings:  ?Hernia Location: Ventral hernia location: Umbilical (M3) and Infraumbilical (M4) ?Hernia Size:  11 cm tall by 7.5 cm wide  ?Mesh Size &Type:  27 cm wide x 30 cm tall  Bard Soft Mesh ?8 cm x 8 cm vicryl mesh to patch the posterior layer of the abdominal wall reconstruction ?Mesh Position: Sublay - Retromuscular ?Myofascial Releases:  ?Bilateral posterior rectus myofascial release ?Right sided transversus abdominus myofascial release ? ? ?Description of Procedure: ?The patient was positioned supine, moderately flexed at the umbilical level, padded and secured on the operating table.  A timeout procedure was performed.   ? ?What is described is a robotic, totally extraperitoneal retromuscular incisional hernia repair with bilateral rectus myofascial release, right-sided transversus abdominis release and retromuscular mesh placement. ? ?Laparoscopic Portion: ?The retrorectus space was entered in the LEFT hypochondrium, at approximately the midclavicular line utilizing a 5 mm optical-viewing trocar.  Upon safe entry into this space, it was insufflated while performing a blunt dissection with the camera still in the optical trocar. A rectus myofascial release was performed on the LEFT side. Dissection was carried out laterally in the retromuscular plane to the edge of the rectus sheath progressively disconnecting the rectus muscle from the underlying posterior rectus sheath. Both the segmental innervation as well as the intercostal artery and vein brances to the rectus muscle were individually preserved.   ? ?During the left sided retrorectus dissection,  a 12 mm trocar was placed into the lateral most edge of the retrorectus space.  With these initial trocars in position, the  medial most aspect of the retrorectus plane was identified, and the posterior sheath was visualized as it inserted on the linea alba. The posterior sheath was incised with cautery entering the preperitoneal plane. A crossover was performed dissecting under the linea alba in the preperitoneal plane until the right rectus sheath was identified. ? ?After identification of the right rectus sheath, it was incised vertically to enter the retrorectus space on the right. A rectus myofascial release was performed on the RIGHT side.  Blunt dissection was carried out laterally in the retromuscular plane to the edge of the rectus sheath progressively disconnecting the rectus muscle from the underlying posterior rectus sheath. Both the segmental innervation as well as the intercostal artery and vein brances to the rectus muscle were individually preserved.  ? ?At this juncture, both retrorectus planes were initially connected to each other and there was space for further trocar placement. An 8 mm robotic trocar was placed in the midclavicular line in right retrorectus space.  A 77m robotic trocar was placed within the left rectus musculature in the upper abdomen, and not through the linea alba.  The initial 5 mm access trocar in the midclavicular line within the left retrorectus space was switched out for an 8 mm robotic trocar.  ? ?Robotic Portion: ?The Intuitive daVinci Xi surgical robot was docked in the standard fashion and the procedure begun from the robotic console. A Prograsp instrument and monopolar shears were used for the dissection. ? ?Dissection was carried down inferiorly preserving the peritoneum and the preperitoneal fat in the midline as it was gently dissected off of the overlying linea alba.  On the right side, the posterior rectus sheath was progressively disconnected from its insertion on the linea alba. This allowed for progression of the right side rectus myofascial release.  The rectus myofascial release  accomplished medialization of the posterior rectus sheath towards the midline and disinsertion of the rectus muscle from its surrounding fascia, and thus its encasement in the rectus sheath, allowing for widening of the rectus muscle and transfer of the rectus flap towards the midline.  This will allow for future inset of the medial aspect of the flap for abdominal wall reconstruction. ? ?Similarly, on the left side, the posterior rectus sheath was also progressively disconnected from its insertion on the linea alba.  This allowed for progression of the left side rectus myofascial release.  The rectus myofascial release accomplished medialization of the posterior rectus sheath towards the midline and disinsertion of the rectus muscle from its surrounding fascia, and thus its encasement in the rectus sheath, allowing for widening of the rectus muscle and transfer of the rectus flap towards the midline.  This will allow for future inset of the medial aspect of the flap for abdominal wall reconstruction. ? ? ?During the dissection of the midline the hernia defect was identified and the hernia sac was not reducible, therefore the hernia peritoneum was incised circumferentially around the edge of the hernia defect which left a defect within the peritoneum in the midline.  This defect was later closed with a 8 cm x 8 cm round Vicryl mesh patch sewn in place using 2-0 spiral stratafix suture.   ? ?During the dissection the previously placed retromuscular mesh was encountered.  It appeared there was a central mesh fracture at the superior aspect of the mesh which led  to the recurrent incisional hernia.  The previously placed Ethicon ultra Pro retromuscular mesh was not removed, but was incorporated into the posterior layer of the abdominal wall reconstruction.  The dissection continued inferiorly as far as possible without disturbing the previous retromuscular meshes fixation to the pubic tubercle and inguinal area as  described by Dr. Pollie Friar previous operative note.  The inferior aspect of the mesh still appeared intact.  There was significant overlap of the newly placed retromuscular mesh with the previously placed retro

## 2022-01-23 NOTE — Progress Notes (Signed)
Patient was encourage to walk but reports dizziness, she also wants to hold her scheduled Gabapentin for now. We will continue to monitor. ?

## 2022-01-24 DIAGNOSIS — K432 Incisional hernia without obstruction or gangrene: Secondary | ICD-10-CM | POA: Diagnosis not present

## 2022-01-24 LAB — CBC
HCT: 38.5 % (ref 36.0–46.0)
Hemoglobin: 12.8 g/dL (ref 12.0–15.0)
MCH: 31.4 pg (ref 26.0–34.0)
MCHC: 33.2 g/dL (ref 30.0–36.0)
MCV: 94.4 fL (ref 80.0–100.0)
Platelets: 174 10*3/uL (ref 150–400)
RBC: 4.08 MIL/uL (ref 3.87–5.11)
RDW: 12.2 % (ref 11.5–15.5)
WBC: 7.7 10*3/uL (ref 4.0–10.5)
nRBC: 0 % (ref 0.0–0.2)

## 2022-01-24 LAB — BASIC METABOLIC PANEL
Anion gap: 8 (ref 5–15)
BUN: 11 mg/dL (ref 8–23)
CO2: 26 mmol/L (ref 22–32)
Calcium: 8.4 mg/dL — ABNORMAL LOW (ref 8.9–10.3)
Chloride: 108 mmol/L (ref 98–111)
Creatinine, Ser: 0.73 mg/dL (ref 0.44–1.00)
GFR, Estimated: >60 mL/min (ref >60)
Glucose, Bld: 161 mg/dL — ABNORMAL HIGH (ref 70–99)
Potassium: 4.3 mmol/L (ref 3.5–5.1)
Sodium: 142 mmol/L (ref 135–145)

## 2022-01-24 NOTE — Discharge Summary (Signed)
Patient ID: Susan Montes [id number] 71 y.o. 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 07/24/2011    Operations: Procedure(s): XI ROBOTIC ASSISTED RECURRENT INCISIONAL HERNIA REPAIR WITH MESH  Admission Condition: good  Discharged Condition: good  Indication for Admission: Recurrent ventral hernia  Hospital Course: Ms. Blomquist underwent robotic recurrent ventral hernia repair and was discharged the following day.  Consults: None  Significant Diagnostic Studies: none  Treatments: surgery: as above  Disposition: Home  Patient Instructions:  Allergies as of 01/24/2022       Reactions   Anesthetics, Amide Nausea And Vomiting   Statins    Myalgia, Headache   Sulfacetamide Sodium    Pt unaware of allergy    Tradjenta [linagliptin]    Headache, headache and arthralgias but tolerates other dpp4 inh   Welchol [colesevelam]    Abdominal Pain   Zetia [ezetimibe] Palpitations   Other reaction(s): tachycardia        Medication List     TAKE these medications    BC FAST PAIN RELIEF PO Take 1 packet by mouth daily as needed  (pain).   clobetasol 0.05 % external solution Commonly known as: TEMOVATE Apply 1 application daily as needed topically (for scalp).   clotrimazole-betamethasone cream Commonly known as: Lotrisone APPLY TO THE AFFECTED AND SURROUNDING AREAS OF SKIN TOPICALLY 2 TIMES PER DAY IN THE MORNING AND EVENING FOR 2 WEEKS What changed:  how much to take how to take this when to take this   FREESTYLE LITE test strip Generic drug: glucose blood Use 1 to check blood glucose once daily.   ibuprofen 200 MG tablet Commonly known as: ADVIL Take 400 mg every 6 (six) hours as needed by mouth for headache or moderate pain.   levothyroxine 75 MCG tablet Commonly known as: SYNTHROID Take 75 mcg daily before breakfast by mouth.   MULTIVITAMIN PO Take 1 tablet by mouth daily.   naproxen sodium 220 MG tablet Commonly known as: ALEVE Take 220 mg 2 (two) times daily as needed by mouth (for pain/headaches).   nebivolol 5 MG tablet Commonly known as: BYSTOLIC Take 5 mg by mouth daily.   Ozempic (1 MG/DOSE) 4 MG/3ML Sopn Generic drug: Semaglutide (1 MG/DOSE) Inject 1 mg into the skin every Monday.   Repatha SureClick 196 MG/ML Soaj Generic drug: Evolocumab Inject 140 mg into the skin every 14 (fourteen) days.   TURMERIC PO Take 1,000 mg by mouth at bedtime.   Vitamin D3 125 MCG (5000 UT) Caps Take 5,000 Units by mouth at bedtime.        Activity: no heavy lifting for 4 weeks Diet: regular diet Wound Care: keep wound clean and dry  Follow-up:  With  Dr. Thermon Leyland in 4 week.  Signed: Nickola Major Visente Kirker [facility], [place]   01/24/2022, 8:25 AM

## 2022-01-24 NOTE — Discharge Instructions (Signed)
 VENTRAL HERNIA REPAIR POST OPERATIVE INSTRUCTIONS  Thinking Clearly  The anesthesia may cause you to feel different for 1 or 2 days. Do not drive, drink alcohol, or make any big decisions for at least 2 days.  Nutrition When you wake up, you will be able to drink small amounts of liquid. If you do not feel sick, you can slowly advance your diet to regular foods. Continue to drink lots of fluids, usually about 8 to 10 glasses per day. Eat a high-fiber diet so you don't strain during bowel movements. High-Fiber Foods Foods high in fiber include beans, bran cereals and whole-grain breads, peas, dried fruit (figs, apricots, and dates), raspberries, blackberries, strawberries, sweet corn, broccoli, baked potatoes with skin, plums, pears, apples, greens, and nuts. Activity Slowly increase your activity. Be sure to get up and walk every hour or so to prevent blood clots. No heavy lifting or strenuous activity for 4 weeks following surgery to prevent hernias at your incision sites or recurrence of your hernia. It is normal to feel tired. You may need more sleep than usual.  Get your rest but make sure to get up and move around frequently to prevent blood clots and pneumonia.  Work and Return to School You can go back to work when you feel well enough. Discuss the timing with your surgeon. You can usually go back to school or work 1 week or less after an laparoscopic or an open repair. If your work requires heavy lifting or strenuous activity you need to be placed on light duty for 4 weeks following surgery. You can return to gym class, sports or other physical activities 4 weeks after surgery.  Wound Care You may experience significant bruising throughout the abdominal wall that may track down into the groin including into the scrotum in males.  Rest, elevating the groin and scrotum above the level of the heart, ice and compression with tight fitting underwear or an abdominal binder can help.   Always wash your hands before and after touching near your incision site. Do not soak in a bathtub until cleared at your follow up appointment. You may take a shower 24 hours after surgery. A small amount of drainage from the incision is normal. If the drainage is thick and yellow or the site is red, you may have an infection, so call your surgeon. If you have a drain in one of your incisions, it will be taken out in office when the drainage stops. Steri-Strips will fall off in 7 to 10 days or they will be removed during your first office visit. If you have dermabond glue covering over the incision, allow the glue to flake off on its own. Protect the new skin, especially from the sun. The sun can burn and cause darker scarring. Your scar will heal in about 4 to 6 weeks and will become softer and continue to fade over the next year.  The cosmetic appearance of the incisions will improve over the course of the first year after surgery. Sensation around your incision will return in a few weeks or months.  Bowel Movements After intestinal surgery, you may have loose watery stools for several days. If watery diarrhea lasts longer than 3 days, contact your surgeon. Pain medication (narcotics) can cause constipation. Increase the fiber in your diet with high-fiber foods if you are constipated. You can take an over the counter stool softener like Colace to avoid constipation.  Additional over the counter medications can also be used   if Colace isn't sufficient (for example, Milk of Magnesia or Miralax).  Pain The amount of pain is different for each person. Some people need only 1 to 3 doses of pain control medication, while others need more. Take alternating doses of tylenol and ibuprofen around the clock for the first five days following surgery.  This will provide a baseline of pain control and help with inflammation.  Take the narcotic pain medication in addition if needed for severe pain.  Contact  Your Surgeon at [phone], if you have: Pain that will not go away Pain that gets worse A fever of more than 101F (38.3C) Repeated vomiting Swelling, redness, bleeding, or bad-smelling drainage from your wound site Strong abdominal pain No bowel movement or unable to pass gas for 3 days Watery diarrhea lasting longer than 3 days  Pain Control The goal of pain control is to minimize pain, keep you moving and help you heal. Your surgical team will work with you on your pain plan. Most often a combination of therapies and medications are used to control your pain. You may also be given medication (local anesthetic) at the surgical site. This may help control your pain for several days. Extreme pain puts extra stress on your body at a time when your body needs to focus on healing. Do not wait until your pain has reached a level "10" or is unbearable before telling your doctor or nurse. It is much easier to control pain before it becomes severe. Following a laparoscopic procedure, pain is sometimes felt in the shoulder. This is due to the gas inserted into your abdomen during the procedure. Moving and walking helps to decrease the gas and the right shoulder pain.  Use the guide below for ways to manage your post-operative pain. Learn more by going to facs.org/safepaincontrol.  How Intense Is My Pain Common Therapies to Feel Better       I hardly notice my pain, and it does not interfere with my activities.  I notice my pain and it distracts me, but I can still do activities (sitting up, walking, standing).  Non-Medication Therapies  Ice (in a bag, applied over clothing at the surgical site), elevation, rest, meditation, massage, distraction (music, TV, play) walking and mild exercise Splinting the abdomen with pillows +  Non-Opioid Medications Acetaminophen (Tylenol) Non-steroidal anti-inflammatory drugs (NSAIDS) Aspirin, Ibuprofen (Motrin, Advil) Naproxen (Aleve) Take these as  needed, when you feel pain. Both acetaminophen and NSAIDs help to decrease pain and swelling (inflammation).      My pain is hard to ignore and is more noticeable even when I rest.  My pain interferes with my usual activities.  Non-Medication Therapies  +  Non-Opioid medications  Take on a regular schedule (around-the-clock) instead of as needed. (For example, Tylenol every 6 hours at 9:00 am, 3:00 pm, 9:00 pm, 3:00 am and Motrin every 6 hours at 12:00 am, 6:00 am, 12:00 pm, 6:00 pm)         I am focused on my pain, and I am not doing my daily activities.  I am groaning in pain, and I cannot sleep. I am unable to do anything.  My pain is as bad as it could be, and nothing else matters.  Non-Medication Therapies  +  Around-the-Clock Non-Opioid Medications  +  Short-acting opioids  Opioids should be used with other medications to manage severe pain. Opioids block pain and give a feeling of euphoria (feel high). Addiction, a serious side effect of opioids, is   rare with short-term (a few days) use.  Examples of short-acting opioids include: Tramadol (Ultram), Hydrocodone (Norco, Vicodin), Hydromorphone (Dilaudid), Oxycodone (Oxycontin)     The above directions have been adapted from the American College of Surgeons Surgical Patient Education Program.  Please refer to the ACS website if needed: https://[email or web address].org/-/media/files/education/[email or web address]   Adrielle Polakowski, MD [facility] [address], [address], [place], [place]  [address] ?  [address] [address], [place], [place]   [address] [phone] ? [phone] ? FAX [phone] Web site: [email or web address]  

## 2022-01-24 NOTE — Progress Notes (Signed)
Transition of Care [facility]) Screening Note  Patient Details  Name: Corrissa Martello Wyss Date of Birth: [birth year]  [facility]) CM/SW Contact:    Sherie Don, LCSW Phone Number: 01/24/2022, 8:54 AM  Transition of Care Department [facility]) has reviewed patient and no TOC needs have been identified at this time. We will continue to monitor patient advancement through interdisciplinary progression rounds. If new patient transition needs arise, please place a TOC consult.

## 2022-01-24 NOTE — Progress Notes (Signed)
Discharge instructions discussed with patient and family, verbalized agreement and understanding 

## 2022-01-25 ENCOUNTER — Encounter (HOSPITAL_COMMUNITY): Payer: Self-pay | Admitting: Surgery

## 2022-02-15 ENCOUNTER — Other Ambulatory Visit (HOSPITAL_COMMUNITY): Payer: Self-pay

## 2022-02-18 ENCOUNTER — Other Ambulatory Visit: Payer: PPO

## 2022-02-18 DIAGNOSIS — E785 Hyperlipidemia, unspecified: Secondary | ICD-10-CM

## 2022-02-18 LAB — HEPATIC FUNCTION PANEL
ALT: 25 IU/L (ref 0–32)
AST: 22 IU/L (ref 0–40)
Albumin: 4.3 g/dL (ref 3.8–4.8)
Alkaline Phosphatase: 132 IU/L — ABNORMAL HIGH (ref 44–121)
Bilirubin Total: 0.3 mg/dL (ref 0.0–1.2)
Bilirubin, Direct: 0.12 mg/dL (ref 0.00–0.40)
Total Protein: 7 g/dL (ref 6.0–8.5)

## 2022-02-18 LAB — LIPID PANEL
Chol/HDL Ratio: 2.3 ratio (ref 0.0–4.4)
Cholesterol, Total: 135 mg/dL (ref 100–199)
HDL: 58 mg/dL (ref >39)
LDL Chol Calc (NIH): 40 mg/dL (ref 0–99)
Triglycerides: 240 mg/dL — ABNORMAL HIGH (ref 0–149)
VLDL Cholesterol Cal: 37 mg/dL (ref 5–40)

## 2022-03-04 ENCOUNTER — Other Ambulatory Visit (HOSPITAL_COMMUNITY): Payer: Self-pay

## 2022-03-04 MED ORDER — CLOBETASOL PROPIONATE 0.05 % EX SOLN
1.0000 | Freq: Two times a day (BID) | CUTANEOUS | 0 refills | Status: DC
  Filled 2022-03-04: qty 50, 25d supply, fill #0

## 2022-05-06 ENCOUNTER — Other Ambulatory Visit (HOSPITAL_COMMUNITY): Payer: Self-pay

## 2022-05-07 ENCOUNTER — Other Ambulatory Visit (HOSPITAL_COMMUNITY): Payer: Self-pay

## 2022-05-07 MED ORDER — CLOBETASOL PROPIONATE 0.05 % EX SOLN
1.0000 | Freq: Two times a day (BID) | CUTANEOUS | 9 refills | Status: DC
  Filled 2022-05-07 – 2022-11-18 (×4): qty 50, 25d supply, fill #0

## 2022-05-08 ENCOUNTER — Other Ambulatory Visit: Payer: Self-pay | Admitting: Registered Nurse

## 2022-05-08 ENCOUNTER — Ambulatory Visit
Admission: RE | Admit: 2022-05-08 | Discharge: 2022-05-08 | Disposition: A | Discharge status: Home / Self Care | Payer: PPO | Source: Ambulatory Visit | Attending: Registered Nurse | Admitting: Registered Nurse

## 2022-05-08 DIAGNOSIS — Z1231 Encounter for screening mammogram for malignant neoplasm of breast: Secondary | ICD-10-CM

## 2022-07-22 ENCOUNTER — Encounter: Payer: Self-pay | Admitting: Interventional Cardiology

## 2022-07-22 ENCOUNTER — Telehealth: Payer: Self-pay | Admitting: Interventional Cardiology

## 2022-07-22 DIAGNOSIS — E785 Hyperlipidemia, unspecified: Secondary | ICD-10-CM

## 2022-07-22 DIAGNOSIS — I25119 Atherosclerotic heart disease of native coronary artery with unspecified angina pectoris: Secondary | ICD-10-CM

## 2022-07-22 NOTE — Telephone Encounter (Signed)
Error

## 2022-07-22 NOTE — Telephone Encounter (Signed)
Pt c/o medication issue:  1. Name of Medication:   Evolocumab (REPATHA SURECLICK) 834 MG/ML SOAJ    2. How are you currently taking this medication (dosage and times per day)?   3. Are you having a reaction (difficulty breathing--STAT)?   4. What is your medication issue? Lokesh from [employer] advance called to fill out PA for this medication

## 2022-07-23 ENCOUNTER — Other Ambulatory Visit (HOSPITAL_COMMUNITY): Payer: Self-pay

## 2022-07-23 MED ORDER — REPATHA SURECLICK 140 MG/ML ~~LOC~~ SOAJ
140.0000 mg | SUBCUTANEOUS | 3 refills | Status: DC
  Filled 2022-07-23: qty 6, 84d supply, fill #0

## 2022-07-23 NOTE — Telephone Encounter (Signed)
Went to submit PA but it states Prior Authorization duplicate/in progress

## 2022-07-23 NOTE — Telephone Encounter (Signed)
PA for Repatha approved through 07/24/23

## 2022-07-23 NOTE — Telephone Encounter (Signed)
Fax received for more information. Questions answered and faxed back with documentation

## 2022-07-25 ENCOUNTER — Other Ambulatory Visit (HOSPITAL_COMMUNITY): Payer: Self-pay

## 2022-08-28 ENCOUNTER — Other Ambulatory Visit (HOSPITAL_COMMUNITY): Payer: Self-pay

## 2022-08-28 MED ORDER — LAGEVRIO 200 MG PO CAPS
4.0000 | ORAL_CAPSULE | Freq: Two times a day (BID) | ORAL | 0 refills | Status: DC
  Filled 2022-08-28: qty 40, 5d supply, fill #0

## 2022-09-27 DIAGNOSIS — D2271 Melanocytic nevi of right lower limb, including hip: Secondary | ICD-10-CM | POA: Diagnosis not present

## 2022-09-27 DIAGNOSIS — L570 Actinic keratosis: Secondary | ICD-10-CM | POA: Diagnosis not present

## 2022-09-27 DIAGNOSIS — Z85820 Personal history of malignant melanoma of skin: Secondary | ICD-10-CM | POA: Diagnosis not present

## 2022-09-27 DIAGNOSIS — L814 Other melanin hyperpigmentation: Secondary | ICD-10-CM | POA: Diagnosis not present

## 2022-10-03 DIAGNOSIS — E039 Hypothyroidism, unspecified: Secondary | ICD-10-CM | POA: Diagnosis not present

## 2022-10-03 DIAGNOSIS — E1165 Type 2 diabetes mellitus with hyperglycemia: Secondary | ICD-10-CM | POA: Diagnosis not present

## 2022-10-10 DIAGNOSIS — E1165 Type 2 diabetes mellitus with hyperglycemia: Secondary | ICD-10-CM | POA: Diagnosis not present

## 2022-10-10 DIAGNOSIS — I10 Essential (primary) hypertension: Secondary | ICD-10-CM | POA: Diagnosis not present

## 2022-10-10 DIAGNOSIS — G720 Drug-induced myopathy: Secondary | ICD-10-CM | POA: Diagnosis not present

## 2022-10-10 DIAGNOSIS — I2510 Atherosclerotic heart disease of native coronary artery without angina pectoris: Secondary | ICD-10-CM | POA: Diagnosis not present

## 2022-10-10 DIAGNOSIS — T466X5A Adverse effect of antihyperlipidemic and antiarteriosclerotic drugs, initial encounter: Secondary | ICD-10-CM | POA: Diagnosis not present

## 2022-10-10 DIAGNOSIS — E7800 Pure hypercholesterolemia, unspecified: Secondary | ICD-10-CM | POA: Diagnosis not present

## 2022-10-10 DIAGNOSIS — Z0000 Encounter for general adult medical examination without abnormal findings: Secondary | ICD-10-CM | POA: Diagnosis not present

## 2022-10-10 DIAGNOSIS — E559 Vitamin D deficiency, unspecified: Secondary | ICD-10-CM | POA: Diagnosis not present

## 2022-10-10 DIAGNOSIS — Z23 Encounter for immunization: Secondary | ICD-10-CM | POA: Diagnosis not present

## 2022-10-29 NOTE — Progress Notes (Signed)
Cardiology Office Note:    Date:  11/01/2022   ID:  [name], DOB 1951/01/23, MRN [id number]  PCP:  Holland Commons, [place] [facility] Cardiologist:  None   Referring MD: Holland Commons, FNP    History of Present Illness:    Susan Montes is a 72 y.o. female with a hx of elevated Ca score 355, DMII, hepatic steatosis, HTN, prior smoker (quit 30years ago), multiple hernia repairs, and family history of ruptured abdominal aortic aneurysm who was previously followed by Dr. Tamala Julian who now presents to clinic for follow-up.  Was last seen by Dr. Tamala Julian in 11/2021 where she was doing well from a CV perspective without anginal symptoms. Declined taking statins. Abdominal aortic ultrasound without evidence of AAA.   Today, the patient overall feels well. No chest pain, SOB, lightheadedness, syncope, LE edema, orthopnea, or PND. Tolerating medications as prescribed. Blood pressure is well controlled at goal.   Past Medical History:  Diagnosis Date   Arthritis    Breast cancer ([place])    left   Cancer ([place]) 1999   breast- left   Diabetes mellitus    Diabetic nephropathy ([place])    Fatty liver    Hemorrhoids    History of ovarian cyst    Hyperlipidemia    Hypertension    Hypothyroidism    Melanoma ([place]) 2018   right arm   Personal history of radiation therapy    PONV (postoperative nausea and vomiting)    Rosacea    Sigmoid diverticulosis    Vaginal delivery    x2   Ventral hernia     Past Surgical History:  Procedure Laterality Date   ABDOMINAL HYSTERECTOMY  2008   BREAST LUMPECTOMY Left    [place]   lumpectomies april and may   COLONOSCOPY  2010   DIAGNOSTIC LAPAROSCOPY  1988   FOOT SURGERY Right    HEMANGIOMA EXCISION     right   HERNIA REPAIR  2009, 2010   LAPAROSCOPIC ABDOMINAL EXPLORATION N/A 12/22/2017   Procedure: LAPAROSCOPIC AND OPEN LOWER ABDOMINAL WALL HERNIA, WITH REMOVAL OF MESH ERAS PATHWAY;   Surgeon: Alphonsa Overall, MD;  Location: [facility];  Service: General;  Laterality: N/A;  INSERTION OF MESH   WISDOM TOOTH EXTRACTION     XI ROBOTIC ASSISTED VENTRAL HERNIA N/A 01/23/2022   Procedure: XI ROBOTIC ASSISTED RECURRENT [place];  Surgeon: Felicie Morn, MD;  Location: [facility];  Service: General;  Laterality: N/A;    Current Medications: Current Meds  Medication Sig   Cholecalciferol (VITAMIN D3) 5000 units CAPS Take 5,000 Units by mouth at bedtime.    clobetasol (TEMOVATE) 0.05 % external solution Apply topically to scalp twice a day as needed for flares   clotrimazole-betamethasone (LOTRISONE) cream APPLY TO THE AFFECTED AND SURROUNDING AREAS OF SKIN TOPICALLY 2 TIMES PER DAY IN THE MORNING AND EVENING FOR 2 WEEKS (Patient taking differently: 1 application  daily.)   Evolocumab (REPATHA SURECLICK) XX123456 MG/ML SOAJ Inject 140 mg into the skin every 14 (fourteen) days.   glucose blood test strip Use 1 to check blood glucose once daily.   ibuprofen (ADVIL,MOTRIN) 200 MG tablet Take 400 mg every 6 (six) hours as needed by mouth for headache or moderate pain.    levothyroxine (SYNTHROID, LEVOTHROID) 75 MCG tablet Take 75 mcg daily before breakfast by mouth.   Multiple Vitamin (MULTIVITAMIN PO) Take 1 tablet by mouth  daily.     naproxen sodium (ALEVE) 220 MG tablet Take 220 mg 2 (two) times daily as needed by mouth (for pain/headaches).   nebivolol (BYSTOLIC) 5 MG tablet Take 5 mg by mouth daily.   OZEMPIC, 1 MG/DOSE, 4 MG/3ML SOPN Inject 1 mg into the skin every Monday.   TURMERIC PO Take 1,000 mg by mouth at bedtime.     Allergies:   Anesthetics, amide; Statins; Sulfacetamide sodium; Tradjenta [linagliptin]; Welchol [colesevelam]; and Zetia [ezetimibe]   Social History   Socioeconomic History   Marital status: Married    Spouse name: Not on file   Number of children: Not on file   Years of education: Not on file   Highest education level: Not on file   Occupational History   Not on file  Tobacco Use   Smoking status: Former    Types: Cigarettes    Quit date: 09/09/1986    Years since quitting: 36.1   Smokeless tobacco: Never  Vaping Use   Vaping Use: Never used  Substance and Sexual Activity   Alcohol use: Yes    Alcohol/week: 0.0 standard drinks of alcohol    Comment: occ   Drug use: No   Sexual activity: Not on file  Other Topics Concern   Not on file  Social History Narrative   Not on file   Social Determinants of Health   Financial Resource Strain: Not on file  Food Insecurity: Not on file  Transportation Needs: Not on file  Physical Activity: Not on file  Stress: Not on file  Social Connections: Not on file     Family History: The patient's family history includes Breast cancer in her maternal grandmother; Cancer in her maternal grandmother; Diabetes in her father; Hypertension in her father. There is no history of Heart attack or Hyperlipidemia.  ROS:   Please see the history of present illness.     All other systems reviewed and are negative.  EKGs/Labs/Other Studies Reviewed:    The following studies were reviewed today: Ca Score 10/2021: FINDINGS: CORONARY CALCIUM SCORES:   Left Main: 0   LAD: 186   LCx: 13   RCA: 156   Total Agatston Score: 355   MESA database percentile: 91   AORTA MEASUREMENTS:   Ascending Aorta: 34 mm   Descending Aorta: 25 mm   OTHER FINDINGS:   The heart size is within normal limits. No pericardial fluid is identified. Visualized segments of the thoracic aorta and central pulmonary arteries are normal in caliber. Visualized mediastinum and hilar regions demonstrate no lymphadenopathy or masses. There is some scarring in the medial right lower lobe. Visualized lungs show no evidence of pulmonary edema, consolidation, pneumothorax, nodule or pleural fluid. Decreased attenuation of the visualized hepatic parenchyma is consistent with steatosis. Visualized bony  structures are unremarkable.   IMPRESSION: 1. Coronary calcium score of 355 is at the 91st percentile for the patient's age, sex and race. 2. Evidence of hepatic steatosis.  EKG:  EKG is not ordered today.   Recent Labs: 01/24/2022: BUN 11; Creatinine, Ser 0.73; Hemoglobin 12.8; Platelets 174; Potassium 4.3; Sodium 142 02/18/2022: ALT 25  Recent Lipid Panel    Component Value Date/Time   CHOL 135 02/18/2022 1028   TRIG 240 (H) 02/18/2022 1028   HDL 58 02/18/2022 1028   CHOLHDL 2.3 02/18/2022 1028   LDLCALC 40 02/18/2022 1028     Risk Assessment/Calculations:  Physical Exam:    VS:  BP 122/76   Pulse 73   Ht 5' 3.5" (1.613 m)   Wt 188 lb 12.8 oz (85.6 kg)   SpO2 96%   BMI 32.92 kg/m     Wt Readings from Last 3 Encounters:  11/01/22 188 lb 12.8 oz (85.6 kg)  01/23/22 189 lb 12.8 oz (86.1 kg)  01/17/22 189 lb 12.8 oz (86.1 kg)     GEN:  Well nourished, well developed in no acute distress HEENT: Normal NECK: No JVD; No carotid bruits CARDIAC: RRR, no murmurs, rubs, gallops RESPIRATORY:  Clear to auscultation without rales, wheezing or rhonchi  ABDOMEN: Soft, non-tender, non-distended MUSCULOSKELETAL:  No edema; No deformity  SKIN: Warm and dry NEUROLOGIC:  Alert and oriented x 3 PSYCHIATRIC:  Normal affect   ASSESSMENT:    1. Coronary artery disease involving native coronary artery of native heart with angina pectoris ([place])   2. Primary hypertension   3. Hyperlipidemia with target LDL less than 70   4. Hepatic steatosis    PLAN:    In order of problems listed above:  #CAD with Ca score 355: Asymptomatic without anginal symptoms. Has not tolerated statins. Now on repatha. -Continue repatha '140mg'$  q2 weeks  #HTN: Blood pressure is well controlled and at goal. -Continue bystolic '5mg'$  daily  #HLD: -Continue repatha '140mg'$  q 2weeks -LDL well controlled at 40  #DMII: -Continue ozempic '2mg'$  q weekly -Did not tolerate jardiance due to  yeast infection -Management per primary        Medication Adjustments/Labs and Tests Ordered: Current medicines are reviewed at length with the patient today.  Concerns regarding medicines are outlined above.  No orders of the defined types were placed in this encounter.  No orders of the defined types were placed in this encounter.   There are no Patient Instructions on file for this visit.   Signed, Freada Bergeron, MD  11/01/2022 8:17 AM    [place]

## 2022-10-31 DIAGNOSIS — Z0000 Encounter for general adult medical examination without abnormal findings: Secondary | ICD-10-CM | POA: Diagnosis not present

## 2022-11-01 ENCOUNTER — Encounter: Payer: Self-pay | Admitting: Cardiology

## 2022-11-01 ENCOUNTER — Ambulatory Visit: Payer: PPO | Attending: Cardiology | Admitting: Cardiology

## 2022-11-01 VITALS — BP 122/76 | HR 73 | Ht 63.5 in | Wt 188.8 lb

## 2022-11-01 DIAGNOSIS — I25119 Atherosclerotic heart disease of native coronary artery with unspecified angina pectoris: Secondary | ICD-10-CM | POA: Diagnosis not present

## 2022-11-01 DIAGNOSIS — E785 Hyperlipidemia, unspecified: Secondary | ICD-10-CM

## 2022-11-01 DIAGNOSIS — I10 Essential (primary) hypertension: Secondary | ICD-10-CM | POA: Diagnosis not present

## 2022-11-01 DIAGNOSIS — K760 Fatty (change of) liver, not elsewhere classified: Secondary | ICD-10-CM | POA: Diagnosis not present

## 2022-11-01 NOTE — Patient Instructions (Signed)
Medication Instructions:   Your physician recommends that you continue on your current medications as directed. Please refer to the Current Medication list given to you today.  *If you need a refill on your cardiac medications before your next appointment, please call your pharmacy*    Follow-Up: At [facility], you and your health needs are our priority.  As part of our continuing mission to provide you with exceptional heart care, we have created designated Provider Care Teams.  These Care Teams include your primary Cardiologist (physician) and Advanced Practice Providers (APPs -  Physician Assistants and Nurse Practitioners) who all work together to provide you with the care you need, when you need it.  We recommend signing up for the patient portal called "MyChart".  Sign up information is provided on this After Visit Summary.  MyChart is used to connect with patients for Virtual Visits (Telemedicine).  Patients are able to view lab/test results, encounter notes, upcoming appointments, etc.  Non-urgent messages can be sent to your provider as well.   To learn more about what you can do with MyChart, go to [email or web address].    Your next appointment:   1 year(s)  Provider:   Dr. Johney Frame

## 2022-11-07 DIAGNOSIS — I2510 Atherosclerotic heart disease of native coronary artery without angina pectoris: Secondary | ICD-10-CM | POA: Diagnosis not present

## 2022-11-07 DIAGNOSIS — I10 Essential (primary) hypertension: Secondary | ICD-10-CM | POA: Diagnosis not present

## 2022-11-07 DIAGNOSIS — E785 Hyperlipidemia, unspecified: Secondary | ICD-10-CM | POA: Diagnosis not present

## 2022-11-07 DIAGNOSIS — E1165 Type 2 diabetes mellitus with hyperglycemia: Secondary | ICD-10-CM | POA: Diagnosis not present

## 2022-11-07 DIAGNOSIS — E039 Hypothyroidism, unspecified: Secondary | ICD-10-CM | POA: Diagnosis not present

## 2022-11-07 DIAGNOSIS — E7800 Pure hypercholesterolemia, unspecified: Secondary | ICD-10-CM | POA: Diagnosis not present

## 2022-11-18 ENCOUNTER — Other Ambulatory Visit (HOSPITAL_COMMUNITY): Payer: Self-pay

## 2022-11-18 DIAGNOSIS — L738 Other specified follicular disorders: Secondary | ICD-10-CM | POA: Diagnosis not present

## 2022-11-19 ENCOUNTER — Other Ambulatory Visit (HOSPITAL_COMMUNITY): Payer: Self-pay

## 2022-11-21 ENCOUNTER — Other Ambulatory Visit (HOSPITAL_COMMUNITY): Payer: Self-pay

## 2022-11-28 ENCOUNTER — Other Ambulatory Visit: Payer: Self-pay | Admitting: Pharmacist Clinician (PhC)/ Clinical Pharmacy Specialist

## 2022-11-28 DIAGNOSIS — E785 Hyperlipidemia, unspecified: Secondary | ICD-10-CM

## 2022-11-28 DIAGNOSIS — I25119 Atherosclerotic heart disease of native coronary artery with unspecified angina pectoris: Secondary | ICD-10-CM

## 2022-11-28 MED ORDER — REPATHA SURECLICK 140 MG/ML ~~LOC~~ SOAJ: 140.0000 mg | SUBCUTANEOUS | 3 refills | Status: DC

## 2022-12-16 ENCOUNTER — Other Ambulatory Visit (HOSPITAL_COMMUNITY): Payer: Self-pay

## 2022-12-16 MED ORDER — LEVOTHYROXINE SODIUM 75 MCG PO TABS
ORAL_TABLET | ORAL | 3 refills | Status: AC
  Filled 2022-12-16 – 2022-12-25 (×2): qty 30, 30d supply, fill #0
  Filled ????-??-??: fill #0

## 2022-12-23 ENCOUNTER — Other Ambulatory Visit (HOSPITAL_COMMUNITY): Payer: Self-pay

## 2022-12-23 MED ORDER — LEVOTHYROXINE SODIUM 75 MCG PO TABS
75.0000 ug | ORAL_TABLET | Freq: Every day | ORAL | 3 refills | Status: AC
  Filled 2022-12-23: qty 30, 35d supply, fill #0

## 2022-12-25 ENCOUNTER — Other Ambulatory Visit (HOSPITAL_COMMUNITY): Payer: Self-pay

## 2022-12-30 ENCOUNTER — Other Ambulatory Visit (HOSPITAL_COMMUNITY): Payer: Self-pay

## 2022-12-30 MED ORDER — LEVOTHYROXINE SODIUM 75 MCG PO TABS
75.0000 ug | ORAL_TABLET | Freq: Every day | ORAL | 3 refills | Status: AC
  Filled 2022-12-30: qty 30, 30d supply, fill #0
  Filled 2023-07-21: qty 90, 90d supply, fill #0

## 2023-01-06 ENCOUNTER — Other Ambulatory Visit (HOSPITAL_COMMUNITY): Payer: Self-pay

## 2023-01-06 DIAGNOSIS — N760 Acute vaginitis: Secondary | ICD-10-CM | POA: Diagnosis not present

## 2023-01-06 DIAGNOSIS — Z01419 Encounter for gynecological examination (general) (routine) without abnormal findings: Secondary | ICD-10-CM | POA: Diagnosis not present

## 2023-01-06 DIAGNOSIS — Z6833 Body mass index (BMI) 33.0-33.9, adult: Secondary | ICD-10-CM | POA: Diagnosis not present

## 2023-01-06 MED ORDER — CLOTRIMAZOLE-BETAMETHASONE 1-0.05 % EX CREA
1.0000 | TOPICAL_CREAM | Freq: Two times a day (BID) | CUTANEOUS | 3 refills | Status: AC
  Filled 2023-01-06: qty 45, 15d supply, fill #0
  Filled 2023-05-17: qty 45, 15d supply, fill #1
  Filled 2023-07-20: qty 45, 15d supply, fill #2
  Filled 2023-08-30: qty 45, 15d supply, fill #3

## 2023-01-10 ENCOUNTER — Other Ambulatory Visit (HOSPITAL_COMMUNITY): Payer: Self-pay

## 2023-01-23 DIAGNOSIS — E039 Hypothyroidism, unspecified: Secondary | ICD-10-CM | POA: Diagnosis not present

## 2023-01-23 DIAGNOSIS — E119 Type 2 diabetes mellitus without complications: Secondary | ICD-10-CM | POA: Diagnosis not present

## 2023-01-23 DIAGNOSIS — H9313 Tinnitus, bilateral: Secondary | ICD-10-CM | POA: Diagnosis not present

## 2023-01-23 DIAGNOSIS — H903 Sensorineural hearing loss, bilateral: Secondary | ICD-10-CM | POA: Diagnosis not present

## 2023-01-23 DIAGNOSIS — Z822 Family history of deafness and hearing loss: Secondary | ICD-10-CM | POA: Diagnosis not present

## 2023-02-05 DIAGNOSIS — E1165 Type 2 diabetes mellitus with hyperglycemia: Secondary | ICD-10-CM | POA: Diagnosis not present

## 2023-02-05 DIAGNOSIS — I2510 Atherosclerotic heart disease of native coronary artery without angina pectoris: Secondary | ICD-10-CM | POA: Diagnosis not present

## 2023-02-05 DIAGNOSIS — E039 Hypothyroidism, unspecified: Secondary | ICD-10-CM | POA: Diagnosis not present

## 2023-02-05 DIAGNOSIS — I10 Essential (primary) hypertension: Secondary | ICD-10-CM | POA: Diagnosis not present

## 2023-02-05 DIAGNOSIS — E7800 Pure hypercholesterolemia, unspecified: Secondary | ICD-10-CM | POA: Diagnosis not present

## 2023-03-06 DIAGNOSIS — H40013 Open angle with borderline findings, low risk, bilateral: Secondary | ICD-10-CM | POA: Diagnosis not present

## 2023-03-06 DIAGNOSIS — H04123 Dry eye syndrome of bilateral lacrimal glands: Secondary | ICD-10-CM | POA: Diagnosis not present

## 2023-03-06 DIAGNOSIS — H25813 Combined forms of age-related cataract, bilateral: Secondary | ICD-10-CM | POA: Diagnosis not present

## 2023-03-06 DIAGNOSIS — E119 Type 2 diabetes mellitus without complications: Secondary | ICD-10-CM | POA: Diagnosis not present

## 2023-03-11 DIAGNOSIS — M62838 Other muscle spasm: Secondary | ICD-10-CM | POA: Diagnosis not present

## 2023-03-11 DIAGNOSIS — M9903 Segmental and somatic dysfunction of lumbar region: Secondary | ICD-10-CM | POA: Diagnosis not present

## 2023-03-11 DIAGNOSIS — M62830 Muscle spasm of back: Secondary | ICD-10-CM | POA: Diagnosis not present

## 2023-03-11 DIAGNOSIS — M546 Pain in thoracic spine: Secondary | ICD-10-CM | POA: Diagnosis not present

## 2023-03-11 DIAGNOSIS — M542 Cervicalgia: Secondary | ICD-10-CM | POA: Diagnosis not present

## 2023-03-11 DIAGNOSIS — M9902 Segmental and somatic dysfunction of thoracic region: Secondary | ICD-10-CM | POA: Diagnosis not present

## 2023-03-11 DIAGNOSIS — M545 Low back pain, unspecified: Secondary | ICD-10-CM | POA: Diagnosis not present

## 2023-03-11 DIAGNOSIS — M9901 Segmental and somatic dysfunction of cervical region: Secondary | ICD-10-CM | POA: Diagnosis not present

## 2023-03-18 DIAGNOSIS — M545 Low back pain, unspecified: Secondary | ICD-10-CM | POA: Diagnosis not present

## 2023-03-18 DIAGNOSIS — M9901 Segmental and somatic dysfunction of cervical region: Secondary | ICD-10-CM | POA: Diagnosis not present

## 2023-03-18 DIAGNOSIS — M542 Cervicalgia: Secondary | ICD-10-CM | POA: Diagnosis not present

## 2023-03-18 DIAGNOSIS — M9902 Segmental and somatic dysfunction of thoracic region: Secondary | ICD-10-CM | POA: Diagnosis not present

## 2023-03-18 DIAGNOSIS — M9903 Segmental and somatic dysfunction of lumbar region: Secondary | ICD-10-CM | POA: Diagnosis not present

## 2023-03-18 DIAGNOSIS — M62830 Muscle spasm of back: Secondary | ICD-10-CM | POA: Diagnosis not present

## 2023-03-18 DIAGNOSIS — M62838 Other muscle spasm: Secondary | ICD-10-CM | POA: Diagnosis not present

## 2023-03-18 DIAGNOSIS — M546 Pain in thoracic spine: Secondary | ICD-10-CM | POA: Diagnosis not present

## 2023-03-19 DIAGNOSIS — M542 Cervicalgia: Secondary | ICD-10-CM | POA: Diagnosis not present

## 2023-03-19 DIAGNOSIS — M62838 Other muscle spasm: Secondary | ICD-10-CM | POA: Diagnosis not present

## 2023-03-19 DIAGNOSIS — M9903 Segmental and somatic dysfunction of lumbar region: Secondary | ICD-10-CM | POA: Diagnosis not present

## 2023-03-19 DIAGNOSIS — M9902 Segmental and somatic dysfunction of thoracic region: Secondary | ICD-10-CM | POA: Diagnosis not present

## 2023-03-19 DIAGNOSIS — M545 Low back pain, unspecified: Secondary | ICD-10-CM | POA: Diagnosis not present

## 2023-03-19 DIAGNOSIS — M546 Pain in thoracic spine: Secondary | ICD-10-CM | POA: Diagnosis not present

## 2023-03-19 DIAGNOSIS — M9901 Segmental and somatic dysfunction of cervical region: Secondary | ICD-10-CM | POA: Diagnosis not present

## 2023-03-19 DIAGNOSIS — M62830 Muscle spasm of back: Secondary | ICD-10-CM | POA: Diagnosis not present

## 2023-03-20 DIAGNOSIS — M546 Pain in thoracic spine: Secondary | ICD-10-CM | POA: Diagnosis not present

## 2023-03-20 DIAGNOSIS — M9901 Segmental and somatic dysfunction of cervical region: Secondary | ICD-10-CM | POA: Diagnosis not present

## 2023-03-20 DIAGNOSIS — M9902 Segmental and somatic dysfunction of thoracic region: Secondary | ICD-10-CM | POA: Diagnosis not present

## 2023-03-20 DIAGNOSIS — M62838 Other muscle spasm: Secondary | ICD-10-CM | POA: Diagnosis not present

## 2023-03-20 DIAGNOSIS — M62830 Muscle spasm of back: Secondary | ICD-10-CM | POA: Diagnosis not present

## 2023-03-20 DIAGNOSIS — M542 Cervicalgia: Secondary | ICD-10-CM | POA: Diagnosis not present

## 2023-03-20 DIAGNOSIS — M9903 Segmental and somatic dysfunction of lumbar region: Secondary | ICD-10-CM | POA: Diagnosis not present

## 2023-03-20 DIAGNOSIS — M545 Low back pain, unspecified: Secondary | ICD-10-CM | POA: Diagnosis not present

## 2023-03-26 DIAGNOSIS — M545 Low back pain, unspecified: Secondary | ICD-10-CM | POA: Diagnosis not present

## 2023-03-26 DIAGNOSIS — M546 Pain in thoracic spine: Secondary | ICD-10-CM | POA: Diagnosis not present

## 2023-03-26 DIAGNOSIS — M542 Cervicalgia: Secondary | ICD-10-CM | POA: Diagnosis not present

## 2023-03-26 DIAGNOSIS — M9901 Segmental and somatic dysfunction of cervical region: Secondary | ICD-10-CM | POA: Diagnosis not present

## 2023-03-26 DIAGNOSIS — M62830 Muscle spasm of back: Secondary | ICD-10-CM | POA: Diagnosis not present

## 2023-03-26 DIAGNOSIS — M62838 Other muscle spasm: Secondary | ICD-10-CM | POA: Diagnosis not present

## 2023-03-26 DIAGNOSIS — M9902 Segmental and somatic dysfunction of thoracic region: Secondary | ICD-10-CM | POA: Diagnosis not present

## 2023-03-26 DIAGNOSIS — M9903 Segmental and somatic dysfunction of lumbar region: Secondary | ICD-10-CM | POA: Diagnosis not present

## 2023-03-27 DIAGNOSIS — M545 Low back pain, unspecified: Secondary | ICD-10-CM | POA: Diagnosis not present

## 2023-03-27 DIAGNOSIS — M546 Pain in thoracic spine: Secondary | ICD-10-CM | POA: Diagnosis not present

## 2023-03-27 DIAGNOSIS — M62838 Other muscle spasm: Secondary | ICD-10-CM | POA: Diagnosis not present

## 2023-03-27 DIAGNOSIS — M542 Cervicalgia: Secondary | ICD-10-CM | POA: Diagnosis not present

## 2023-03-27 DIAGNOSIS — M9902 Segmental and somatic dysfunction of thoracic region: Secondary | ICD-10-CM | POA: Diagnosis not present

## 2023-03-27 DIAGNOSIS — M62830 Muscle spasm of back: Secondary | ICD-10-CM | POA: Diagnosis not present

## 2023-03-27 DIAGNOSIS — M9903 Segmental and somatic dysfunction of lumbar region: Secondary | ICD-10-CM | POA: Diagnosis not present

## 2023-03-27 DIAGNOSIS — M9901 Segmental and somatic dysfunction of cervical region: Secondary | ICD-10-CM | POA: Diagnosis not present

## 2023-03-31 ENCOUNTER — Other Ambulatory Visit: Payer: Self-pay | Admitting: Registered Nurse

## 2023-03-31 DIAGNOSIS — M9902 Segmental and somatic dysfunction of thoracic region: Secondary | ICD-10-CM | POA: Diagnosis not present

## 2023-03-31 DIAGNOSIS — M546 Pain in thoracic spine: Secondary | ICD-10-CM | POA: Diagnosis not present

## 2023-03-31 DIAGNOSIS — M9901 Segmental and somatic dysfunction of cervical region: Secondary | ICD-10-CM | POA: Diagnosis not present

## 2023-03-31 DIAGNOSIS — M545 Low back pain, unspecified: Secondary | ICD-10-CM | POA: Diagnosis not present

## 2023-03-31 DIAGNOSIS — M9903 Segmental and somatic dysfunction of lumbar region: Secondary | ICD-10-CM | POA: Diagnosis not present

## 2023-03-31 DIAGNOSIS — M542 Cervicalgia: Secondary | ICD-10-CM | POA: Diagnosis not present

## 2023-03-31 DIAGNOSIS — M62830 Muscle spasm of back: Secondary | ICD-10-CM | POA: Diagnosis not present

## 2023-03-31 DIAGNOSIS — Z1231 Encounter for screening mammogram for malignant neoplasm of breast: Secondary | ICD-10-CM

## 2023-03-31 DIAGNOSIS — M62838 Other muscle spasm: Secondary | ICD-10-CM | POA: Diagnosis not present

## 2023-04-02 DIAGNOSIS — M9903 Segmental and somatic dysfunction of lumbar region: Secondary | ICD-10-CM | POA: Diagnosis not present

## 2023-04-02 DIAGNOSIS — M546 Pain in thoracic spine: Secondary | ICD-10-CM | POA: Diagnosis not present

## 2023-04-02 DIAGNOSIS — M9902 Segmental and somatic dysfunction of thoracic region: Secondary | ICD-10-CM | POA: Diagnosis not present

## 2023-04-02 DIAGNOSIS — M62838 Other muscle spasm: Secondary | ICD-10-CM | POA: Diagnosis not present

## 2023-04-02 DIAGNOSIS — M9901 Segmental and somatic dysfunction of cervical region: Secondary | ICD-10-CM | POA: Diagnosis not present

## 2023-04-02 DIAGNOSIS — M542 Cervicalgia: Secondary | ICD-10-CM | POA: Diagnosis not present

## 2023-04-02 DIAGNOSIS — M62830 Muscle spasm of back: Secondary | ICD-10-CM | POA: Diagnosis not present

## 2023-04-02 DIAGNOSIS — M545 Low back pain, unspecified: Secondary | ICD-10-CM | POA: Diagnosis not present

## 2023-04-03 DIAGNOSIS — M62830 Muscle spasm of back: Secondary | ICD-10-CM | POA: Diagnosis not present

## 2023-04-03 DIAGNOSIS — M546 Pain in thoracic spine: Secondary | ICD-10-CM | POA: Diagnosis not present

## 2023-04-03 DIAGNOSIS — M9903 Segmental and somatic dysfunction of lumbar region: Secondary | ICD-10-CM | POA: Diagnosis not present

## 2023-04-03 DIAGNOSIS — M542 Cervicalgia: Secondary | ICD-10-CM | POA: Diagnosis not present

## 2023-04-03 DIAGNOSIS — M62838 Other muscle spasm: Secondary | ICD-10-CM | POA: Diagnosis not present

## 2023-04-03 DIAGNOSIS — M545 Low back pain, unspecified: Secondary | ICD-10-CM | POA: Diagnosis not present

## 2023-04-03 DIAGNOSIS — M9901 Segmental and somatic dysfunction of cervical region: Secondary | ICD-10-CM | POA: Diagnosis not present

## 2023-04-03 DIAGNOSIS — M9902 Segmental and somatic dysfunction of thoracic region: Secondary | ICD-10-CM | POA: Diagnosis not present

## 2023-04-07 DIAGNOSIS — M62830 Muscle spasm of back: Secondary | ICD-10-CM | POA: Diagnosis not present

## 2023-04-07 DIAGNOSIS — M9901 Segmental and somatic dysfunction of cervical region: Secondary | ICD-10-CM | POA: Diagnosis not present

## 2023-04-07 DIAGNOSIS — M542 Cervicalgia: Secondary | ICD-10-CM | POA: Diagnosis not present

## 2023-04-07 DIAGNOSIS — M546 Pain in thoracic spine: Secondary | ICD-10-CM | POA: Diagnosis not present

## 2023-04-07 DIAGNOSIS — M9902 Segmental and somatic dysfunction of thoracic region: Secondary | ICD-10-CM | POA: Diagnosis not present

## 2023-04-07 DIAGNOSIS — M9903 Segmental and somatic dysfunction of lumbar region: Secondary | ICD-10-CM | POA: Diagnosis not present

## 2023-04-07 DIAGNOSIS — M62838 Other muscle spasm: Secondary | ICD-10-CM | POA: Diagnosis not present

## 2023-04-07 DIAGNOSIS — M545 Low back pain, unspecified: Secondary | ICD-10-CM | POA: Diagnosis not present

## 2023-04-09 DIAGNOSIS — M9901 Segmental and somatic dysfunction of cervical region: Secondary | ICD-10-CM | POA: Diagnosis not present

## 2023-04-09 DIAGNOSIS — M542 Cervicalgia: Secondary | ICD-10-CM | POA: Diagnosis not present

## 2023-04-09 DIAGNOSIS — M545 Low back pain, unspecified: Secondary | ICD-10-CM | POA: Diagnosis not present

## 2023-04-09 DIAGNOSIS — M9902 Segmental and somatic dysfunction of thoracic region: Secondary | ICD-10-CM | POA: Diagnosis not present

## 2023-04-09 DIAGNOSIS — M62838 Other muscle spasm: Secondary | ICD-10-CM | POA: Diagnosis not present

## 2023-04-09 DIAGNOSIS — M62830 Muscle spasm of back: Secondary | ICD-10-CM | POA: Diagnosis not present

## 2023-04-09 DIAGNOSIS — M546 Pain in thoracic spine: Secondary | ICD-10-CM | POA: Diagnosis not present

## 2023-04-09 DIAGNOSIS — M9903 Segmental and somatic dysfunction of lumbar region: Secondary | ICD-10-CM | POA: Diagnosis not present

## 2023-04-10 DIAGNOSIS — M62838 Other muscle spasm: Secondary | ICD-10-CM | POA: Diagnosis not present

## 2023-04-10 DIAGNOSIS — M545 Low back pain, unspecified: Secondary | ICD-10-CM | POA: Diagnosis not present

## 2023-04-10 DIAGNOSIS — M546 Pain in thoracic spine: Secondary | ICD-10-CM | POA: Diagnosis not present

## 2023-04-10 DIAGNOSIS — M62830 Muscle spasm of back: Secondary | ICD-10-CM | POA: Diagnosis not present

## 2023-04-10 DIAGNOSIS — E039 Hypothyroidism, unspecified: Secondary | ICD-10-CM | POA: Diagnosis not present

## 2023-04-10 DIAGNOSIS — E1165 Type 2 diabetes mellitus with hyperglycemia: Secondary | ICD-10-CM | POA: Diagnosis not present

## 2023-04-10 DIAGNOSIS — M9901 Segmental and somatic dysfunction of cervical region: Secondary | ICD-10-CM | POA: Diagnosis not present

## 2023-04-10 DIAGNOSIS — M542 Cervicalgia: Secondary | ICD-10-CM | POA: Diagnosis not present

## 2023-04-10 DIAGNOSIS — M9903 Segmental and somatic dysfunction of lumbar region: Secondary | ICD-10-CM | POA: Diagnosis not present

## 2023-04-10 DIAGNOSIS — M9902 Segmental and somatic dysfunction of thoracic region: Secondary | ICD-10-CM | POA: Diagnosis not present

## 2023-04-10 DIAGNOSIS — E7800 Pure hypercholesterolemia, unspecified: Secondary | ICD-10-CM | POA: Diagnosis not present

## 2023-04-15 ENCOUNTER — Other Ambulatory Visit (HOSPITAL_COMMUNITY): Payer: Self-pay

## 2023-04-16 ENCOUNTER — Other Ambulatory Visit (HOSPITAL_COMMUNITY): Payer: Self-pay

## 2023-04-16 MED ORDER — LEVOTHYROXINE SODIUM 75 MCG PO TABS
75.0000 ug | ORAL_TABLET | Freq: Every day | ORAL | 1 refills | Status: AC
  Filled 2023-04-16: qty 90, 90d supply, fill #0
  Filled 2023-07-20: qty 90, 90d supply, fill #1

## 2023-04-16 MED ORDER — NEBIVOLOL HCL 5 MG PO TABS
5.0000 mg | ORAL_TABLET | Freq: Every day | ORAL | 1 refills | Status: DC
  Filled 2023-04-16: qty 90, 90d supply, fill #0

## 2023-04-22 DIAGNOSIS — I2510 Atherosclerotic heart disease of native coronary artery without angina pectoris: Secondary | ICD-10-CM | POA: Diagnosis not present

## 2023-04-22 DIAGNOSIS — E119 Type 2 diabetes mellitus without complications: Secondary | ICD-10-CM | POA: Diagnosis not present

## 2023-04-22 DIAGNOSIS — E7800 Pure hypercholesterolemia, unspecified: Secondary | ICD-10-CM | POA: Diagnosis not present

## 2023-04-22 DIAGNOSIS — I10 Essential (primary) hypertension: Secondary | ICD-10-CM | POA: Diagnosis not present

## 2023-04-22 DIAGNOSIS — E039 Hypothyroidism, unspecified: Secondary | ICD-10-CM | POA: Diagnosis not present

## 2023-04-22 DIAGNOSIS — E559 Vitamin D deficiency, unspecified: Secondary | ICD-10-CM | POA: Diagnosis not present

## 2023-04-22 DIAGNOSIS — E1165 Type 2 diabetes mellitus with hyperglycemia: Secondary | ICD-10-CM | POA: Diagnosis not present

## 2023-04-22 DIAGNOSIS — G729 Myopathy, unspecified: Secondary | ICD-10-CM | POA: Diagnosis not present

## 2023-04-23 ENCOUNTER — Other Ambulatory Visit (HOSPITAL_COMMUNITY): Payer: Self-pay

## 2023-04-23 MED ORDER — MOUNJARO 10 MG/0.5ML ~~LOC~~ SOAJ
10.0000 mg | SUBCUTANEOUS | 3 refills | Status: DC
  Filled 2023-04-23: qty 2, 28d supply, fill #0
  Filled 2023-05-17: qty 2, 28d supply, fill #1
  Filled 2023-06-15: qty 2, 28d supply, fill #2
  Filled 2023-07-20: qty 2, 28d supply, fill #3

## 2023-04-24 ENCOUNTER — Other Ambulatory Visit (HOSPITAL_COMMUNITY): Payer: Self-pay

## 2023-04-24 DIAGNOSIS — H04123 Dry eye syndrome of bilateral lacrimal glands: Secondary | ICD-10-CM | POA: Diagnosis not present

## 2023-04-24 DIAGNOSIS — H00022 Hordeolum internum right lower eyelid: Secondary | ICD-10-CM | POA: Diagnosis not present

## 2023-04-24 MED ORDER — MAXITROL 3.5-10000-0.1 OP OINT
TOPICAL_OINTMENT | Freq: Three times a day (TID) | OPHTHALMIC | 1 refills | Status: AC
Start: 2023-04-24 — End: ?
  Filled 2023-04-24: qty 3.5, 7d supply, fill #0

## 2023-04-29 ENCOUNTER — Other Ambulatory Visit (HOSPITAL_COMMUNITY): Payer: Self-pay

## 2023-05-07 ENCOUNTER — Other Ambulatory Visit (HOSPITAL_COMMUNITY): Payer: Self-pay

## 2023-05-13 ENCOUNTER — Ambulatory Visit: Payer: PPO

## 2023-05-13 ENCOUNTER — Ambulatory Visit
Admission: RE | Admit: 2023-05-13 | Discharge: 2023-05-13 | Disposition: A | Discharge status: Home / Self Care | Payer: PPO | Source: Ambulatory Visit | Attending: Registered Nurse | Admitting: Registered Nurse

## 2023-05-13 DIAGNOSIS — Z1231 Encounter for screening mammogram for malignant neoplasm of breast: Secondary | ICD-10-CM

## 2023-05-17 ENCOUNTER — Other Ambulatory Visit (HOSPITAL_COMMUNITY): Payer: Self-pay

## 2023-05-19 ENCOUNTER — Other Ambulatory Visit (HOSPITAL_COMMUNITY): Payer: Self-pay

## 2023-05-19 ENCOUNTER — Other Ambulatory Visit: Payer: Self-pay

## 2023-05-19 MED ORDER — CLOBETASOL PROPIONATE 0.05 % EX SOLN
1.0000 | Freq: Two times a day (BID) | CUTANEOUS | 0 refills | Status: AC
  Filled 2023-05-19: qty 50, 30d supply, fill #0

## 2023-05-20 ENCOUNTER — Other Ambulatory Visit (HOSPITAL_COMMUNITY): Payer: Self-pay

## 2023-05-20 ENCOUNTER — Other Ambulatory Visit: Payer: Self-pay | Admitting: Interventional Cardiology

## 2023-05-20 DIAGNOSIS — I25119 Atherosclerotic heart disease of native coronary artery with unspecified angina pectoris: Secondary | ICD-10-CM

## 2023-05-20 DIAGNOSIS — E785 Hyperlipidemia, unspecified: Secondary | ICD-10-CM

## 2023-05-20 MED ORDER — REPATHA SURECLICK 140 MG/ML ~~LOC~~ SOAJ
140.0000 mg | SUBCUTANEOUS | 3 refills | Status: DC
  Filled 2023-05-20: qty 6, 84d supply, fill #0
  Filled 2023-07-20 – 2023-08-04 (×2): qty 6, 84d supply, fill #1
  Filled 2023-11-03 (×2): qty 2, 28d supply, fill #2

## 2023-05-20 MED ORDER — GLUCOSE BLOOD VI STRP
1.0000 | ORAL_STRIP | Freq: Every day | 5 refills | Status: AC
Start: 2023-05-19 — End: ?
  Filled 2023-05-20: qty 100, 90d supply, fill #0
  Filled 2023-06-15: qty 100, 100d supply, fill #0

## 2023-05-21 ENCOUNTER — Other Ambulatory Visit: Payer: Self-pay

## 2023-05-21 ENCOUNTER — Other Ambulatory Visit (HOSPITAL_COMMUNITY): Payer: Self-pay

## 2023-05-22 ENCOUNTER — Other Ambulatory Visit (HOSPITAL_COMMUNITY): Payer: Self-pay

## 2023-06-13 DIAGNOSIS — C50912 Malignant neoplasm of unspecified site of left female breast: Secondary | ICD-10-CM | POA: Diagnosis not present

## 2023-06-15 ENCOUNTER — Other Ambulatory Visit (HOSPITAL_COMMUNITY): Payer: Self-pay

## 2023-06-16 ENCOUNTER — Other Ambulatory Visit: Payer: Self-pay

## 2023-06-16 ENCOUNTER — Other Ambulatory Visit (HOSPITAL_COMMUNITY): Payer: Self-pay

## 2023-06-16 DIAGNOSIS — E785 Hyperlipidemia, unspecified: Secondary | ICD-10-CM | POA: Diagnosis not present

## 2023-06-16 DIAGNOSIS — E119 Type 2 diabetes mellitus without complications: Secondary | ICD-10-CM | POA: Diagnosis not present

## 2023-06-16 DIAGNOSIS — I2510 Atherosclerotic heart disease of native coronary artery without angina pectoris: Secondary | ICD-10-CM | POA: Diagnosis not present

## 2023-06-16 DIAGNOSIS — I10 Essential (primary) hypertension: Secondary | ICD-10-CM | POA: Diagnosis not present

## 2023-06-16 DIAGNOSIS — E7800 Pure hypercholesterolemia, unspecified: Secondary | ICD-10-CM | POA: Diagnosis not present

## 2023-06-16 DIAGNOSIS — E1165 Type 2 diabetes mellitus with hyperglycemia: Secondary | ICD-10-CM | POA: Diagnosis not present

## 2023-06-16 DIAGNOSIS — E039 Hypothyroidism, unspecified: Secondary | ICD-10-CM | POA: Diagnosis not present

## 2023-06-16 MED ORDER — ONETOUCH VERIO VI STRP
1.0000 | ORAL_STRIP | Freq: Two times a day (BID) | 11 refills | Status: AC
Start: 2023-06-16 — End: ?
  Filled 2023-06-16: qty 50, 25d supply, fill #0
  Filled 2023-06-17: qty 100, 50d supply, fill #0

## 2023-06-17 ENCOUNTER — Other Ambulatory Visit (HOSPITAL_COMMUNITY): Payer: Self-pay

## 2023-06-17 MED ORDER — ONETOUCH VERIO FLEX SYSTEM W/DEVICE KIT
1.0000 | PACK | Freq: Two times a day (BID) | 0 refills | Status: AC
  Filled 2023-06-17: qty 1, 30d supply, fill #0

## 2023-06-17 MED ORDER — ONETOUCH DELICA PLUS LANCET33G MISC
1.0000 | Freq: Two times a day (BID) | 11 refills | Status: AC
  Filled 2023-06-17: qty 100, 90d supply, fill #0

## 2023-06-20 ENCOUNTER — Other Ambulatory Visit (HOSPITAL_COMMUNITY): Payer: Self-pay

## 2023-06-20 DIAGNOSIS — S0502XA Injury of conjunctiva and corneal abrasion without foreign body, left eye, initial encounter: Secondary | ICD-10-CM | POA: Diagnosis not present

## 2023-06-20 DIAGNOSIS — H1132 Conjunctival hemorrhage, left eye: Secondary | ICD-10-CM | POA: Diagnosis not present

## 2023-06-20 MED ORDER — ERYTHROMYCIN 5 MG/GM OP OINT
1.0000 | TOPICAL_OINTMENT | Freq: Every day | OPHTHALMIC | 2 refills | Status: AC
  Filled 2023-06-20: qty 3.5, 30d supply, fill #0

## 2023-06-20 MED ORDER — MOXIFLOXACIN HCL 0.5 % OP SOLN
1.0000 [drp] | Freq: Three times a day (TID) | OPHTHALMIC | 2 refills | Status: AC
  Filled 2023-06-20: qty 3, 20d supply, fill #0

## 2023-06-24 DIAGNOSIS — S0502XA Injury of conjunctiva and corneal abrasion without foreign body, left eye, initial encounter: Secondary | ICD-10-CM | POA: Diagnosis not present

## 2023-06-24 DIAGNOSIS — H1132 Conjunctival hemorrhage, left eye: Secondary | ICD-10-CM | POA: Diagnosis not present

## 2023-07-07 ENCOUNTER — Other Ambulatory Visit (HOSPITAL_COMMUNITY): Payer: Self-pay

## 2023-07-08 ENCOUNTER — Other Ambulatory Visit (HOSPITAL_COMMUNITY): Payer: Self-pay

## 2023-07-08 MED ORDER — NEBIVOLOL HCL 5 MG PO TABS
5.0000 mg | ORAL_TABLET | Freq: Every day | ORAL | 3 refills | Status: AC
  Filled 2023-07-08 – 2023-07-20 (×2): qty 90, 90d supply, fill #0
  Filled 2023-10-23: qty 90, 90d supply, fill #1
  Filled 2024-01-13: qty 90, 90d supply, fill #2

## 2023-07-11 ENCOUNTER — Other Ambulatory Visit (HOSPITAL_COMMUNITY): Payer: Self-pay

## 2023-07-18 ENCOUNTER — Other Ambulatory Visit (HOSPITAL_COMMUNITY): Payer: Self-pay

## 2023-07-21 ENCOUNTER — Other Ambulatory Visit (HOSPITAL_COMMUNITY): Payer: Self-pay

## 2023-07-21 ENCOUNTER — Other Ambulatory Visit: Payer: Self-pay

## 2023-07-31 ENCOUNTER — Other Ambulatory Visit (HOSPITAL_COMMUNITY): Payer: Self-pay

## 2023-08-01 ENCOUNTER — Other Ambulatory Visit (HOSPITAL_COMMUNITY): Payer: Self-pay

## 2023-08-04 ENCOUNTER — Other Ambulatory Visit: Payer: Self-pay

## 2023-08-30 ENCOUNTER — Other Ambulatory Visit (HOSPITAL_COMMUNITY): Payer: Self-pay

## 2023-09-01 ENCOUNTER — Other Ambulatory Visit (HOSPITAL_COMMUNITY): Payer: Self-pay

## 2023-09-01 ENCOUNTER — Other Ambulatory Visit: Payer: Self-pay

## 2023-09-01 MED ORDER — MOUNJARO 10 MG/0.5ML ~~LOC~~ SOAJ
10.0000 mg | SUBCUTANEOUS | 1 refills | Status: AC
  Filled 2023-09-01: qty 6, 84d supply, fill #0

## 2023-09-09 DIAGNOSIS — H40013 Open angle with borderline findings, low risk, bilateral: Secondary | ICD-10-CM | POA: Diagnosis not present

## 2023-09-09 DIAGNOSIS — H04123 Dry eye syndrome of bilateral lacrimal glands: Secondary | ICD-10-CM | POA: Diagnosis not present

## 2023-10-23 ENCOUNTER — Other Ambulatory Visit: Payer: Self-pay

## 2023-10-27 ENCOUNTER — Other Ambulatory Visit (HOSPITAL_COMMUNITY): Payer: Self-pay

## 2023-11-03 ENCOUNTER — Other Ambulatory Visit (HOSPITAL_COMMUNITY): Payer: Self-pay

## 2023-11-13 ENCOUNTER — Other Ambulatory Visit (HOSPITAL_COMMUNITY): Payer: Self-pay

## 2023-11-13 ENCOUNTER — Telehealth: Payer: Self-pay | Admitting: Pharmacist

## 2023-11-13 ENCOUNTER — Telehealth: Payer: Self-pay | Admitting: Pharmacy Technician

## 2023-11-13 NOTE — Telephone Encounter (Signed)
 Received letter from insurance Repatha is no longer their preferred [id number].   We will assess coverage for Praluent.

## 2023-11-13 NOTE — Telephone Encounter (Signed)
 Pharmacy Patient Advocate Encounter   Received notification from Pt Calls Messages that prior authorization for praluent is required/requested.   Insurance verification completed.   The patient is insured through [employer] .   Per test claim: PA required; PA submitted to above mentioned insurance via CoverMyMeds Key/confirmation #/[facility] Status is pending

## 2023-11-14 ENCOUNTER — Other Ambulatory Visit (HOSPITAL_COMMUNITY): Payer: Self-pay

## 2023-11-14 MED ORDER — PRALUENT 75 MG/ML ~~LOC~~ SOAJ
75.0000 mg | SUBCUTANEOUS | 3 refills | Status: DC
  Filled 2023-11-14: qty 6, 84d supply, fill #0

## 2023-11-14 MED ORDER — PRALUENT 75 MG/ML ~~LOC~~ SOAJ: 75.0000 mg | SUBCUTANEOUS | 3 refills | Status: AC

## 2023-11-14 MED ORDER — PRALUENT 75 MG/ML ~~LOC~~ SOAJ: 75.0000 mg | SUBCUTANEOUS | 3 refills | Status: DC

## 2023-11-14 NOTE — Addendum Note (Signed)
 Addended by: Tylene Fantasia on: 11/14/2023 12:11 PM   Modules accepted: Orders

## 2023-11-14 NOTE — Telephone Encounter (Signed)
 Pharmacy Patient Advocate Encounter  Received notification from [facility] that Prior Authorization for praluent has been APPROVED from 09/10/23 to 09/08/24. Ran test claim, Copay is $186.42. This test claim was processed through [facility]- copay amounts may vary at other pharmacies due to pharmacy/plan contracts, or as the patient moves through the different stages of their insurance plan.   PA #/Case ID/Reference #: [id number]

## 2023-11-14 NOTE — Addendum Note (Signed)
 Addended by: Tylene Fantasia on: 11/14/2023 04:47 PM   Modules accepted: Orders

## 2023-12-18 LAB — COLOGUARD: COLOGUARD: NEGATIVE

## 2024-01-13 ENCOUNTER — Other Ambulatory Visit (HOSPITAL_COMMUNITY): Payer: Self-pay
